# Patient Record
Sex: Female | Born: 1955 | Race: White | Hispanic: No | Marital: Married | Smoking: Never smoker
Health system: Southern US, Community
[De-identification: ages and names within clinical notes are randomized; demographics above are authoritative.]

## PROBLEM LIST (undated history)

## (undated) DIAGNOSIS — E785 Hyperlipidemia, unspecified: Secondary | ICD-10-CM

## (undated) HISTORY — PX: URETHRAL SLING: SHX2621

## (undated) HISTORY — DX: Hyperlipidemia, unspecified: E78.5

## (undated) HISTORY — PX: CATARACT EXTRACTION, BILATERAL: SHX1313

## (undated) HISTORY — PX: ABDOMINAL HYSTERECTOMY: SHX81

## (undated) HISTORY — PX: CHOLECYSTECTOMY: SHX55

## (undated) HISTORY — PX: HERNIA REPAIR: SHX51

---

## 1998-08-23 HISTORY — PX: ABDOMINAL HYSTERECTOMY: SHX81

## 1999-08-24 HISTORY — PX: CHOLECYSTECTOMY: SHX55

## 2013-05-10 ENCOUNTER — Other Ambulatory Visit: Payer: Self-pay

## 2013-07-06 ENCOUNTER — Ambulatory Visit (INDEPENDENT_AMBULATORY_CARE_PROVIDER_SITE_OTHER): Payer: BC Managed Care – PPO | Admitting: General Practice

## 2013-07-06 ENCOUNTER — Encounter: Payer: Self-pay | Admitting: General Practice

## 2013-07-06 VITALS — BP 152/91 | HR 77 | Temp 97.8°F | Ht 59.0 in | Wt 138.0 lb

## 2013-07-06 DIAGNOSIS — E785 Hyperlipidemia, unspecified: Secondary | ICD-10-CM

## 2013-07-06 MED ORDER — SIMVASTATIN 40 MG PO TABS: 40.0000 mg | ORAL_TABLET | Freq: Every day | ORAL | Status: DC

## 2013-07-06 NOTE — Progress Notes (Signed)
  Subjective:    Patient ID: Amber Winters, female    DOB: 08-06-1956, 57 y.o.   MRN: [id number]  HPI Patient presents today for chronic health follow up. She has a history of hyperlipidemia and taking medication (crestor) normally as directed, although out of medication for past two weeks. She reports trying to eat healthier. Denies any other medical problems or concerns.     Review of Systems  Constitutional: Negative for fever and chills.  Respiratory: Negative for cough, chest tightness, shortness of breath and wheezing.   Cardiovascular: Negative for chest pain and palpitations.  Gastrointestinal: Negative for abdominal pain, diarrhea, constipation and blood in stool.  Genitourinary: Negative for dysuria, hematuria and difficulty urinating.  Musculoskeletal: Negative for back pain, neck pain and neck stiffness.  Neurological: Negative for dizziness, weakness and headaches.       Objective:   Physical Exam  Constitutional: She is oriented to person, place, and time. She appears well-developed and well-nourished.  HENT:  Head: Normocephalic and atraumatic.  Right Ear: External ear normal.  Left Ear: External ear normal.  Nose: Nose normal.  Mouth/Throat: Oropharynx is clear and moist.  Eyes: EOM are normal. Pupils are equal, round, and reactive to light.  Neck: Normal range of motion. Neck supple. No thyromegaly present.  Cardiovascular: Normal rate, regular rhythm and normal heart sounds.   Pulmonary/Chest: Effort normal and breath sounds normal. No respiratory distress. She exhibits no tenderness.  Abdominal: Soft. Bowel sounds are normal. She exhibits no distension. There is no tenderness.  Musculoskeletal: She exhibits no edema and no tenderness.  Lymphadenopathy:    She has no cervical adenopathy.  Neurological: She is alert and oriented to person, place, and time.  Skin: Skin is warm and dry.  Psychiatric: She has a normal mood and affect.          Assessment & Plan:   1. Hyperlipidemia - CMP14+EGFR - NMR, lipoprofile - simvastatin (ZOCOR) 40 MG tablet; Take 1 tablet (40 mg total) by mouth daily.  Dispense: 30 tablet; Refill: 5 -Continue all current medications Labs pending F/u in 6 months Discussed exercise and diet  Patient verbalized understanding Coralie Keens, FNP-C

## 2013-07-06 NOTE — Patient Instructions (Signed)
Hypertriglyceridemia  Diet for High blood levels of Triglycerides Most fats in food are triglycerides. Triglycerides in your blood are stored as fat in your body. High levels of triglycerides in your blood may put you at a greater risk for heart disease and stroke.  Normal triglyceride levels are less than 150 mg/dL. Borderline high levels are 150-199 mg/dl. High levels are 200 - 499 mg/dL, and very high triglyceride levels are greater than 500 mg/dL. The decision to treat high triglycerides is generally based on the level. For people with borderline or high triglyceride levels, treatment includes weight loss and exercise. Drugs are recommended for people with very high triglyceride levels. Many people who need treatment for high triglyceride levels have metabolic syndrome. This syndrome is a collection of disorders that often include: insulin resistance, high blood pressure, blood clotting problems, high cholesterol and triglycerides. TESTING PROCEDURE FOR TRIGLYCERIDES  You should not eat 4 hours before getting your triglycerides measured. The normal range of triglycerides is between 10 and 250 milligrams per deciliter (mg/dl). Some people may have extreme levels (1000 or above), but your triglyceride level may be too high if it is above 150 mg/dl, depending on what other risk factors you have for heart disease.  People with high blood triglycerides may also have high blood cholesterol levels. If you have high blood cholesterol as well as high blood triglycerides, your risk for heart disease is probably greater than if you only had high triglycerides. High blood cholesterol is one of the main risk factors for heart disease. CHANGING YOUR DIET  Your weight can affect your blood triglyceride level. If you are more than 20% above your ideal body weight, you may be able to lower your blood triglycerides by losing weight. Eating less and exercising regularly is the best way to combat this. Fat provides more  calories than any other food. The best way to lose weight is to eat less fat. Only 30% of your total calories should come from fat. Less than 7% of your diet should come from saturated fat. A diet low in fat and saturated fat is the same as a diet to decrease blood cholesterol. By eating a diet lower in fat, you may lose weight, lower your blood cholesterol, and lower your blood triglyceride level.  Eating a diet low in fat, especially saturated fat, may also help you lower your blood triglyceride level. Ask your dietitian to help you figure how much fat you can eat based on the number of calories your caregiver has prescribed for you.  Exercise, in addition to helping with weight loss may also help lower triglyceride levels.   Alcohol can increase blood triglycerides. You may need to stop drinking alcoholic beverages.  Too much carbohydrate in your diet may also increase your blood triglycerides. Some complex carbohydrates are necessary in your diet. These may include bread, rice, potatoes, other starchy vegetables and cereals.  Reduce "simple" carbohydrates. These may include pure sugars, candy, honey, and jelly without losing other nutrients. If you have the kind of high blood triglycerides that is affected by the amount of carbohydrates in your diet, you will need to eat less sugar and less high-sugar foods. Your caregiver can help you with this.  Adding 2-4 grams of fish oil (EPA+ DHA) may also help lower triglycerides. Speak with your caregiver before adding any supplements to your regimen. Following the Diet  Maintain your ideal weight. Your caregivers can help you with a diet. Generally, eating less food and getting more   exercise will help you lose weight. Joining a weight control group may also help. Ask your caregivers for a good weight control group in your area.  Eat low-fat foods instead of high-fat foods. This can help you lose weight too.  These foods are lower in fat. Eat MORE of these:    Dried beans, peas, and lentils.  Egg whites.  Low-fat cottage cheese.  Fish.  Lean cuts of meat, such as round, sirloin, rump, and flank (cut extra fat off meat you fix).  Whole grain breads, cereals and pasta.  Skim and nonfat dry milk.  Low-fat yogurt.  Poultry without the skin.  Cheese made with skim or part-skim milk, such as mozzarella, parmesan, farmers', ricotta, or pot cheese. These are higher fat foods. Eat LESS of these:   Whole milk and foods made from whole milk, such as American, blue, cheddar, monterey jack, and swiss cheese  High-fat meats, such as luncheon meats, sausages, knockwurst, bratwurst, hot dogs, ribs, corned beef, ground pork, and regular ground beef.  Fried foods. Limit saturated fats in your diet. Substituting unsaturated fat for saturated fat may decrease your blood triglyceride level. You will need to read package labels to know which products contain saturated fats.  These foods are high in saturated fat. Eat LESS of these:   Fried pork skins.  Whole milk.  Skin and fat from poultry.  Palm oil.  Butter.  Shortening.  Cream cheese.  Bacon.  Margarines and baked goods made from listed oils.  Vegetable shortenings.  Chitterlings.  Fat from meats.  Coconut oil.  Palm kernel oil.  Lard.  Cream.  Sour cream.  Fatback.  Coffee whiteners and non-dairy creamers made with these oils.  Cheese made from whole milk. Use unsaturated fats (both polyunsaturated and monounsaturated) moderately. Remember, even though unsaturated fats are better than saturated fats; you still want a diet low in total fat.  These foods are high in unsaturated fat:   Canola oil.  Sunflower oil.  Mayonnaise.  Almonds.  Peanuts.  Pine nuts.  Margarines made with these oils.  Safflower oil.  Olive oil.  Avocados.  Cashews.  Peanut butter.  Sunflower seeds.  Soybean oil.  Peanut  oil.  Olives.  Pecans.  Walnuts.  Pumpkin seeds. Avoid sugar and other high-sugar foods. This will decrease carbohydrates without decreasing other nutrients. Sugar in your food goes rapidly to your blood. When there is excess sugar in your blood, your liver may use it to make more triglycerides. Sugar also contains calories without other important nutrients.  Eat LESS of these:   Sugar, brown sugar, powdered sugar, jam, jelly, preserves, honey, syrup, molasses, pies, candy, cakes, cookies, frosting, pastries, colas, soft drinks, punches, fruit drinks, and regular gelatin.  Avoid alcohol. Alcohol, even more than sugar, may increase blood triglycerides. In addition, alcohol is high in calories and low in nutrients. Ask for sparkling water, or a diet soft drink instead of an alcoholic beverage. Suggestions for planning and preparing meals   Bake, broil, grill or roast meats instead of frying.  Remove fat from meats and skin from poultry before cooking.  Add spices, herbs, lemon juice or vinegar to vegetables instead of salt, rich sauces or gravies.  Use a non-stick skillet without fat or use no-stick sprays.  Cool and refrigerate stews and broth. Then remove the hardened fat floating on the surface before serving.  Refrigerate meat drippings and skim off fat to make low-fat gravies.  Serve more fish.  Use less butter,   margarine and other high-fat spreads on bread or vegetables.  Use skim or reconstituted non-fat dry milk for cooking.  Cook with low-fat cheeses.  Substitute low-fat yogurt or cottage cheese for all or part of the sour cream in recipes for sauces, dips or congealed salads.  Use half yogurt/half mayonnaise in salad recipes.  Substitute evaporated skim milk for cream. Evaporated skim milk or reconstituted non-fat dry milk can be whipped and substituted for whipped cream in certain recipes.  Choose fresh fruits for dessert instead of high-fat foods such as pies or  cakes. Fruits are naturally low in fat. When Dining Out   Order low-fat appetizers such as fruit or vegetable juice, pasta with vegetables or tomato sauce.  Select clear, rather than cream soups.  Ask that dressings and gravies be served on the side. Then use less of them.  Order foods that are baked, broiled, poached, steamed, stir-fried, or roasted.  Ask for margarine instead of butter, and use only a small amount.  Drink sparkling water, unsweetened tea or coffee, or diet soft drinks instead of alcohol or other sweet beverages. QUESTIONS AND ANSWERS ABOUT OTHER FATS IN THE BLOOD: SATURATED FAT, TRANS FAT, AND CHOLESTEROL What is trans fat? Trans fat is a type of fat that is formed when vegetable oil is hardened through a process called hydrogenation. This process helps makes foods more solid, gives them shape, and prolongs their shelf life. Trans fats are also called hydrogenated or partially hydrogenated oils.  What do saturated fat, trans fat, and cholesterol in foods have to do with heart disease? Saturated fat, trans fat, and cholesterol in the diet all raise the level of LDL "bad" cholesterol in the blood. The higher the LDL cholesterol, the greater the risk for coronary heart disease (CHD). Saturated fat and trans fat raise LDL similarly.  What foods contain saturated fat, trans fat, and cholesterol? High amounts of saturated fat are found in animal products, such as fatty cuts of meat, chicken skin, and full-fat dairy products like butter, whole milk, cream, and cheese, and in tropical vegetable oils such as palm, palm kernel, and coconut oil. Trans fat is found in some of the same foods as saturated fat, such as vegetable shortening, some margarines (especially hard or stick margarine), crackers, cookies, baked goods, fried foods, salad dressings, and other processed foods made with partially hydrogenated vegetable oils. Small amounts of trans fat also occur naturally in some animal  products, such as milk products, beef, and lamb. Foods high in cholesterol include liver, other organ meats, egg yolks, shrimp, and full-fat dairy products. How can I use the new food label to make heart-healthy food choices? Check the Nutrition Facts panel of the food label. Choose foods lower in saturated fat, trans fat, and cholesterol. For saturated fat and cholesterol, you can also use the Percent Daily Value (%DV): 5% DV or less is low, and 20% DV or more is high. (There is no %DV for trans fat.) Use the Nutrition Facts panel to choose foods low in saturated fat and cholesterol, and if the trans fat is not listed, read the ingredients and limit products that list shortening or hydrogenated or partially hydrogenated vegetable oil, which tend to be high in trans fat. POINTS TO REMEMBER:   Discuss your risk for heart disease with your caregivers, and take steps to reduce risk factors.  Change your diet. Choose foods that are low in saturated fat, trans fat, and cholesterol.  Add exercise to your daily routine if   it is not already being done. Participate in physical activity of moderate intensity, like brisk walking, for at least 30 minutes on most, and preferably all days of the week. No time? Break the 30 minutes into three, 10-minute segments during the day.  Stop smoking. If you do smoke, contact your caregiver to discuss ways in which they can help you quit.  Do not use street drugs.  Maintain a normal weight.  Maintain a healthy blood pressure.  Keep up with your blood work for checking the fats in your blood as directed by your caregiver. Document Released: 05/27/2004 Document Revised: 02/08/2012 Document Reviewed: 12/23/2008 Exit[employer] Patient Information 2014 [employer].  

## 2013-07-08 LAB — CMP14+EGFR
Albumin: 4.5 g/dL (ref 3.5–5.5)
BUN/Creatinine Ratio: 23 (ref 9–23)
BUN: 14 mg/dL (ref 6–24)
CO2: 26 mmol/L (ref 18–29)
Calcium: 10.1 mg/dL (ref 8.7–10.2)
Creatinine, Ser: 0.62 mg/dL (ref 0.57–1.00)
Globulin, Total: 2.7 g/dL (ref 1.5–4.5)
Glucose: 80 mg/dL (ref 65–99)
Total Protein: 7.2 g/dL (ref 6.0–8.5)

## 2013-07-08 LAB — NMR, LIPOPROFILE
Cholesterol: 263 mg/dL — ABNORMAL HIGH (ref <200)
HDL Cholesterol by NMR: 71 mg/dL (ref >=40)
HDL Particle Number: 44.6 umol/L (ref >=30.5)
LDLC SERPL CALC-MCNC: 155 mg/dL — ABNORMAL HIGH (ref <100)

## 2013-07-12 ENCOUNTER — Other Ambulatory Visit: Payer: Self-pay | Admitting: General Practice

## 2013-07-13 ENCOUNTER — Telehealth: Payer: Self-pay | Admitting: General Practice

## 2013-07-13 NOTE — Telephone Encounter (Signed)
Patient aware.

## 2013-09-28 ENCOUNTER — Telehealth: Payer: Self-pay | Admitting: General Practice

## 2013-09-28 ENCOUNTER — Ambulatory Visit (INDEPENDENT_AMBULATORY_CARE_PROVIDER_SITE_OTHER): Payer: BC Managed Care – PPO

## 2013-09-28 DIAGNOSIS — Z7189 Other specified counseling: Secondary | ICD-10-CM

## 2013-09-28 NOTE — Telephone Encounter (Signed)
GOING TO SCHED. APPT

## 2013-09-29 LAB — MEASLES/MUMPS/RUBELLA IMMUNITY
MUMPS ABS, IGG: 110 [AU]/ml (ref >10.9)
RUBELLA: 1 {index} (ref >0.99)
RUBEOLA AB, IGG: 157 [AU]/ml (ref >29.9)

## 2013-10-02 ENCOUNTER — Telehealth: Payer: Self-pay | Admitting: General Practice

## 2013-10-02 NOTE — Telephone Encounter (Signed)
Can you review the results for them please.

## 2013-10-03 NOTE — Telephone Encounter (Signed)
Please inform lab results indicate immunity

## 2013-10-03 NOTE — Telephone Encounter (Signed)
Per [facility] patient has immunity on labs to MMR. Pt  Notified and verbalized understanding.

## 2013-11-12 ENCOUNTER — Telehealth: Payer: Self-pay | Admitting: General Practice

## 2013-11-13 NOTE — Telephone Encounter (Signed)
appt with mae scheduled

## 2013-11-16 ENCOUNTER — Encounter: Payer: Self-pay | Admitting: General Practice

## 2013-11-16 ENCOUNTER — Ambulatory Visit (INDEPENDENT_AMBULATORY_CARE_PROVIDER_SITE_OTHER): Payer: BC Managed Care – PPO | Admitting: General Practice

## 2013-11-16 VITALS — BP 147/83 | HR 90 | Temp 99.1°F | Ht 59.0 in | Wt 146.0 lb

## 2013-11-16 DIAGNOSIS — K219 Gastro-esophageal reflux disease without esophagitis: Secondary | ICD-10-CM

## 2013-11-16 MED ORDER — ESOMEPRAZOLE MAGNESIUM 20 MG PO PACK: 20.0000 mg | PACK | Freq: Every day | ORAL | Status: DC

## 2013-11-16 NOTE — Progress Notes (Signed)
   Subjective:    Patient ID: Amber Winters, female    DOB: Aug 17, 1956, 58 y.o.   MRN: [id number]  Gastrophageal Reflux She complains of heartburn. She reports no abdominal pain, no belching, no chest pain, no coughing, no sore throat or no wheezing. This is a new problem. The current episode started 1 to 4 weeks ago. The problem has been unchanged. The heartburn does not wake her from sleep. The heartburn does not limit her activity. The heartburn doesn't change with position. The symptoms are aggravated by lying down. There are no known risk factors.      Review of Systems  Constitutional: Negative for fever and chills.  HENT: Negative for sore throat.   Respiratory: Negative for cough, chest tightness and wheezing.   Cardiovascular: Negative for chest pain and palpitations.  Gastrointestinal: Positive for heartburn. Negative for abdominal pain.  Neurological: Negative for dizziness, weakness and headaches.       Objective:   Physical Exam  Constitutional: She is oriented to person, place, and time. She appears well-developed and well-nourished.  Cardiovascular: Normal rate, regular rhythm and normal heart sounds.   Pulmonary/Chest: Breath sounds normal. No respiratory distress. She has no wheezes.  Neurological: She is alert and oriented to person, place, and time.  Skin: Skin is warm and dry.  Psychiatric: She has a normal mood and affect.          Assessment & Plan:  1. GERD (gastroesophageal reflux disease) - esomeprazole (NEXIUM) 20 MG packet; Take 20 mg by mouth daily before breakfast.  Dispense: 30 each; Refill: 5 -GERD patient education provided and discussed -RTO if symptoms worsen or no improvement Patient verbalized understanding Erby Pian, FNP-C

## 2013-11-16 NOTE — Patient Instructions (Signed)
Gastroesophageal Reflux Disease, Adult  Gastroesophageal reflux disease (GERD) happens when acid from your stomach flows up into the esophagus. When acid comes in contact with the esophagus, the acid causes soreness (inflammation) in the esophagus. Over time, GERD may create small holes (ulcers) in the lining of the esophagus.  CAUSES   · Increased body weight. This puts pressure on the stomach, making acid rise from the stomach into the esophagus.  · Smoking. This increases acid production in the stomach.  · Drinking alcohol. This causes decreased pressure in the lower esophageal sphincter (valve or ring of muscle between the esophagus and stomach), allowing acid from the stomach into the esophagus.  · Late evening meals and a full stomach. This increases pressure and acid production in the stomach.  · A malformed lower esophageal sphincter.  Sometimes, no cause is found.  SYMPTOMS   · Burning pain in the lower part of the mid-chest behind the breastbone and in the mid-stomach area. This may occur twice a week or more often.  · Trouble swallowing.  · Sore throat.  · Dry cough.  · Asthma-like symptoms including chest tightness, shortness of breath, or wheezing.  DIAGNOSIS   Your caregiver may be able to diagnose GERD based on your symptoms. In some cases, X-rays and other tests may be done to check for complications or to check the condition of your stomach and esophagus.  TREATMENT   Your caregiver may recommend over-the-counter or prescription medicines to help decrease acid production. Ask your caregiver before starting or adding any new medicines.   HOME CARE INSTRUCTIONS   · Change the factors that you can control. Ask your caregiver for guidance concerning weight loss, quitting smoking, and alcohol consumption.  · Avoid foods and drinks that make your symptoms worse, such as:  · Caffeine or alcoholic drinks.  · Chocolate.  · Peppermint or mint flavorings.  · Garlic and onions.  · Spicy foods.  · Citrus fruits,  such as oranges, lemons, or limes.  · Tomato-based foods such as sauce, chili, salsa, and pizza.  · Fried and fatty foods.  · Avoid lying down for the 3 hours prior to your bedtime or prior to taking a nap.  · Eat small, frequent meals instead of large meals.  · Wear loose-fitting clothing. Do not wear anything tight around your waist that causes pressure on your stomach.  · Raise the head of your bed 6 to 8 inches with wood blocks to help you sleep. Extra pillows will not help.  · Only take over-the-counter or prescription medicines for pain, discomfort, or fever as directed by your caregiver.  · Do not take aspirin, ibuprofen, or other nonsteroidal anti-inflammatory drugs (NSAIDs).  SEEK IMMEDIATE MEDICAL CARE IF:   · You have pain in your arms, neck, jaw, teeth, or back.  · Your pain increases or changes in intensity or duration.  · You develop nausea, vomiting, or sweating (diaphoresis).  · You develop shortness of breath, or you faint.  · Your vomit is green, yellow, black, or looks like coffee grounds or blood.  · Your stool is red, bloody, or black.  These symptoms could be signs of other problems, such as heart disease, gastric bleeding, or esophageal bleeding.  MAKE SURE YOU:   · Understand these instructions.  · Will watch your condition.  · Will get help right away if you are not doing well or get worse.  Document Released: 05/19/2005 Document Revised: 11/01/2011 Document Reviewed: 02/26/2011  Exit[employer]® Patient   Information ©2014 [employer].

## 2014-05-11 ENCOUNTER — Other Ambulatory Visit: Payer: Self-pay | Admitting: Nurse Practitioner

## 2014-06-11 ENCOUNTER — Other Ambulatory Visit: Payer: Self-pay | Admitting: Family Medicine

## 2014-06-13 MED ORDER — NEXIUM 20 MG PO CPDR: 20.0000 mg | DELAYED_RELEASE_CAPSULE | Freq: Every day | ORAL | Status: DC

## 2014-06-13 NOTE — Telephone Encounter (Signed)
no more refills without being seen  

## 2014-06-13 NOTE — Addendum Note (Signed)
Addended by: Thana Ates on: 06/13/2014 02:55 PM   Modules accepted: Orders

## 2014-06-13 NOTE — Telephone Encounter (Signed)
Patient of Mae. Was notified at last refill that NTBS. Please advise. Last seen in March 2015

## 2014-07-01 ENCOUNTER — Other Ambulatory Visit: Payer: Self-pay | Admitting: Nurse Practitioner

## 2014-07-01 NOTE — Telephone Encounter (Signed)
Has appointment 08/2014

## 2014-07-25 ENCOUNTER — Other Ambulatory Visit: Payer: Self-pay | Admitting: Nurse Practitioner

## 2014-08-09 ENCOUNTER — Other Ambulatory Visit: Payer: Self-pay | Admitting: Nurse Practitioner

## 2014-09-06 ENCOUNTER — Ambulatory Visit (INDEPENDENT_AMBULATORY_CARE_PROVIDER_SITE_OTHER): Payer: BLUE CROSS/BLUE SHIELD | Admitting: Nurse Practitioner

## 2014-09-06 ENCOUNTER — Encounter: Payer: Self-pay | Admitting: Nurse Practitioner

## 2014-09-06 VITALS — BP 132/84 | HR 81 | Temp 97.4°F | Ht 59.0 in | Wt 145.0 lb

## 2014-09-06 DIAGNOSIS — Z13820 Encounter for screening for osteoporosis: Secondary | ICD-10-CM

## 2014-09-06 DIAGNOSIS — K219 Gastro-esophageal reflux disease without esophagitis: Secondary | ICD-10-CM | POA: Insufficient documentation

## 2014-09-06 DIAGNOSIS — Z23 Encounter for immunization: Secondary | ICD-10-CM

## 2014-09-06 DIAGNOSIS — E785 Hyperlipidemia, unspecified: Secondary | ICD-10-CM

## 2014-09-06 MED ORDER — NEXIUM 20 MG PO CPDR: DELAYED_RELEASE_CAPSULE | ORAL | Status: DC

## 2014-09-06 MED ORDER — SIMVASTATIN 40 MG PO TABS: ORAL_TABLET | ORAL | Status: DC

## 2014-09-06 NOTE — Patient Instructions (Signed)

## 2014-09-06 NOTE — Progress Notes (Signed)
   Subjective:    Patient ID: Amber Winters, female    DOB: 1956/06/21, 59 y.o.   MRN: [id number]   Patient here today for follow up of chronic medical problems.   Hyperlipidemia This is a chronic problem. The current episode started more than 1 year ago. The problem is controlled. Recent lipid tests were reviewed and are variable. Current antihyperlipidemic treatment includes statins. The current treatment provides moderate improvement of lipids. Risk factors for coronary artery disease include dyslipidemia, obesity and post-menopausal.  GERD Patient currently on nexium daily- keeps symptoms under control   Review of Systems  Constitutional: Negative.   HENT: Negative.   Respiratory: Negative.   Cardiovascular: Negative.   Genitourinary: Negative.   Neurological: Negative.   Psychiatric/Behavioral: Negative.   All other systems reviewed and are negative.      Objective:   Physical Exam  Constitutional: She is oriented to person, place, and time. She appears well-developed and well-nourished.  HENT:  Nose: Nose normal.  Mouth/Throat: Oropharynx is clear and moist.  Eyes: EOM are normal.  Neck: Trachea normal, normal range of motion and full passive range of motion without pain. Neck supple. No JVD present. Carotid bruit is not present. No thyromegaly present.  Cardiovascular: Normal rate, regular rhythm, normal heart sounds and intact distal pulses.  Exam reveals no gallop and no friction rub.   No murmur heard. Pulmonary/Chest: Effort normal and breath sounds normal.  Abdominal: Soft. Bowel sounds are normal. She exhibits no distension and no mass. There is no tenderness.  Musculoskeletal: Normal range of motion.  Lymphadenopathy:    She has no cervical adenopathy.  Neurological: She is alert and oriented to person, place, and time. She has normal reflexes.  Skin: Skin is warm and dry.  Psychiatric: She has a normal mood and affect. Her behavior is normal. Judgment and thought  content normal.    BP 132/84 mmHg  Pulse 81  Temp(Src) 97.4 F (36.3 C) (Oral)  Ht $R'4\' 11"'vv$  (1.499 m)  Wt 145 lb (65.772 kg)  BMI 29.27 kg/m2        Assessment & Plan:  1. Hyperlipidemia with target LDL less than 100 Low fat diet - simvastatin (ZOCOR) 40 MG tablet; TAKE 1 TABLET (40 MG TOTAL) BY MOUTH DAILY.  Dispense: 30 tablet; Refill: 5 - CMP14+EGFR - NMR, lipoprofile  2. Gastroesophageal reflux disease without esophagitis Watch spicy foods Do not eat 2 hours prior to bedtime - NEXIUM 20 MG capsule; TAKE 1 CAPSULE (20 MG TOTAL) BY MOUTH DAILY. NO MORE REFILLS UNTIL SEEN.  Dispense: 30 capsule; Refill: 5  3. Screening for osteoporosis Weight bearing - DG Bone Density; Future    Labs pending Health maintenance reviewed Diet and exercise encouraged Continue all meds Follow up  In 6 months   [place], FNP

## 2014-09-07 LAB — CMP14+EGFR
ALK PHOS: 76 IU/L (ref 39–117)
ALT: 12 IU/L (ref 0–32)
AST: 21 IU/L (ref 0–40)
Albumin/Globulin Ratio: 1.7 (ref 1.1–2.5)
Albumin: 4.5 g/dL (ref 3.5–5.5)
BUN/Creatinine Ratio: 30 — ABNORMAL HIGH (ref 9–23)
BUN: 20 mg/dL (ref 6–24)
CHLORIDE: 99 mmol/L (ref 97–108)
CO2: 25 mmol/L (ref 18–29)
Calcium: 9.9 mg/dL (ref 8.7–10.2)
Creatinine, Ser: 0.66 mg/dL (ref 0.57–1.00)
GFR calc non Af Amer: 98 mL/min/{1.73_m2} (ref >59)
GFR, EST AFRICAN AMERICAN: 113 mL/min/{1.73_m2} (ref >59)
GLOBULIN, TOTAL: 2.7 g/dL (ref 1.5–4.5)
GLUCOSE: 81 mg/dL (ref 65–99)
Potassium: 4.9 mmol/L (ref 3.5–5.2)
Sodium: 140 mmol/L (ref 134–144)
Total Bilirubin: 1 mg/dL (ref 0.0–1.2)
Total Protein: 7.2 g/dL (ref 6.0–8.5)

## 2014-09-07 LAB — NMR, LIPOPROFILE
CHOLESTEROL: 210 mg/dL — AB (ref 100–199)
HDL CHOLESTEROL BY NMR: 69 mg/dL (ref >39)
HDL PARTICLE NUMBER: 42.4 umol/L (ref >=30.5)
LDL PARTICLE NUMBER: 1356 nmol/L — AB (ref <1000)
LDL Size: 20.9 nm (ref >20.5)
LDL-C: 121 mg/dL — AB (ref 0–99)
LP-IR SCORE: 44 (ref <=45)
Small LDL Particle Number: 442 nmol/L (ref <=527)
TRIGLYCERIDES BY NMR: 101 mg/dL (ref 0–149)

## 2014-09-09 ENCOUNTER — Telehealth: Payer: Self-pay | Admitting: Nurse Practitioner

## 2014-09-09 NOTE — Telephone Encounter (Signed)
Stp advised of MMM feedback, pt voiced understanding. Will close encounter.

## 2014-09-09 NOTE — Telephone Encounter (Signed)
Was probably form the preservative in vaccine- shouldn't need another one anyway

## 2014-09-11 ENCOUNTER — Other Ambulatory Visit: Payer: Self-pay | Admitting: Nurse Practitioner

## 2014-09-12 MED ORDER — OMEPRAZOLE 40 MG PO CPDR: 40.0000 mg | DELAYED_RELEASE_CAPSULE | Freq: Every day | ORAL | Status: DC

## 2014-09-12 NOTE — Telephone Encounter (Signed)
chaged nexium to omeprazole

## 2014-09-12 NOTE — Telephone Encounter (Signed)
Patient aware.

## 2014-10-31 ENCOUNTER — Other Ambulatory Visit: Payer: Self-pay | Admitting: *Deleted

## 2014-10-31 MED ORDER — OMEPRAZOLE 40 MG PO CPDR: 40.0000 mg | DELAYED_RELEASE_CAPSULE | Freq: Every day | ORAL | Status: DC

## 2014-11-06 ENCOUNTER — Telehealth: Payer: Self-pay

## 2014-11-06 NOTE — Telephone Encounter (Signed)
LMOM to call xray to schedule DEXA appointment

## 2014-11-18 ENCOUNTER — Ambulatory Visit (INDEPENDENT_AMBULATORY_CARE_PROVIDER_SITE_OTHER): Payer: BLUE CROSS/BLUE SHIELD

## 2014-11-18 DIAGNOSIS — Z780 Asymptomatic menopausal state: Secondary | ICD-10-CM

## 2014-11-18 DIAGNOSIS — Z13820 Encounter for screening for osteoporosis: Secondary | ICD-10-CM

## 2014-11-22 ENCOUNTER — Other Ambulatory Visit: Payer: BLUE CROSS/BLUE SHIELD

## 2015-02-22 ENCOUNTER — Other Ambulatory Visit: Payer: Self-pay | Admitting: Nurse Practitioner

## 2015-02-25 NOTE — Telephone Encounter (Signed)
Last seen 09/06/14 MMM

## 2016-04-01 ENCOUNTER — Ambulatory Visit (INDEPENDENT_AMBULATORY_CARE_PROVIDER_SITE_OTHER): Payer: BLUE CROSS/BLUE SHIELD | Admitting: Family

## 2016-04-01 ENCOUNTER — Encounter: Payer: Self-pay | Admitting: Family

## 2016-04-01 VITALS — BP 133/87 | HR 74 | Temp 97.8°F | Ht 59.0 in | Wt 150.0 lb

## 2016-04-01 DIAGNOSIS — E559 Vitamin D deficiency, unspecified: Secondary | ICD-10-CM | POA: Diagnosis not present

## 2016-04-01 DIAGNOSIS — K219 Gastro-esophageal reflux disease without esophagitis: Secondary | ICD-10-CM

## 2016-04-01 DIAGNOSIS — E785 Hyperlipidemia, unspecified: Secondary | ICD-10-CM

## 2016-04-01 DIAGNOSIS — Z1159 Encounter for screening for other viral diseases: Secondary | ICD-10-CM | POA: Diagnosis not present

## 2016-04-01 MED ORDER — VITAMIN D (ERGOCALCIFEROL) 1.25 MG (50000 UNIT) PO CAPS: 50000.0000 [IU] | ORAL_CAPSULE | ORAL | 3 refills | Status: DC

## 2016-04-01 NOTE — Patient Instructions (Signed)
Vitamin D Deficiency Vitamin D deficiency is when your body does not have enough vitamin D. Vitamin D is important to your body for many reasons:  It helps the body to absorb two important minerals, called calcium and phosphorus.  It plays a role in bone health.  It may help to prevent some diseases, such as diabetes and multiple sclerosis.  It plays a role in muscle function, including heart function. You can get vitamin D by:  Eating foods that naturally contain vitamin D.  Eating or drinking milk or other dairy products that have vitamin D added to them.  Taking a vitamin D supplement or a multivitamin supplement that contains vitamin D.  Being in the sun. Your body naturally makes vitamin D when your skin is exposed to sunlight. Your body changes the sunlight into a form of the vitamin that the body can use. If vitamin D deficiency is severe, it can cause a condition in which your bones become soft. In adults, this condition is called osteomalacia. In children, this condition is called rickets. CAUSES Vitamin D deficiency may be caused by:  Not eating enough foods that contain vitamin D.  Not getting enough sun exposure.  Having certain digestive system diseases that make it difficult for your body to absorb vitamin D. These diseases include Crohn disease, chronic pancreatitis, and cystic fibrosis.  Having a surgery in which a part of the stomach or a part of the small intestine is removed.  Being obese.  Having chronic kidney disease or liver disease. RISK FACTORS This condition is more likely to develop in:  Older people.  People who do not spend much time outdoors.  People who live in a long-term care facility.  People who have had broken bones.  People with weak or thin bones (osteoporosis).  People who have a disease or condition that changes how the body absorbs vitamin D.  People who have dark skin.  People who take certain medicines, such as steroid  medicines or certain seizure medicines.  People who are overweight or obese. SYMPTOMS In mild cases of vitamin D deficiency, there may not be any symptoms. If the condition is severe, symptoms may include:  Bone pain.  Muscle pain.  Falling often.  Broken bones caused by a minor injury. DIAGNOSIS This condition is usually diagnosed with a blood test.  TREATMENT Treatment for this condition may depend on what caused the condition. Treatment options include:  Taking vitamin D supplements.  Taking a calcium supplement. Your health care provider will suggest what dose is best for you. HOME CARE INSTRUCTIONS  Take medicines and supplements only as told by your health care provider.  Eat foods that contain vitamin D. Choices include:  Fortified dairy products, cereals, or juices. Fortified means that vitamin D has been added to the food. Check the label on the package to be sure.  Fatty fish, such as salmon or trout.  Eggs.  Oysters.  Do not use a tanning bed.  Maintain a healthy weight. Lose weight, if needed.  Keep all follow-up visits as told by your health care provider. This is important. SEEK MEDICAL CARE IF:  Your symptoms do not go away.  You feel like throwing up (nausea) or you throw up (vomit).  You have fewer bowel movements than usual or it is difficult for you to have a bowel movement (constipation).   This information is not intended to replace advice given to you by your health care provider. Make sure you discuss  any questions you have with your health care provider.   Document Released: 11/01/2011 Document Revised: 04/30/2015 Document Reviewed: 12/25/2014 Elsevier Interactive Patient Education [employer].

## 2016-04-01 NOTE — Progress Notes (Signed)
   Subjective:    Patient ID: Amber Winters, female    DOB: 1955-12-30, 60 y.o.   MRN: [id number]  Pt presents to the office today to get started on Vit D. Pt states she had a stress fracture on her left foot about a month ago and went to her Podiatry who tested her vit d and was told it was low.  Hyperlipidemia  This is a chronic problem. The current episode started more than 1 year ago. The problem is uncontrolled. Recent lipid tests were reviewed and are high. Exacerbating diseases include obesity. She has no history of hypothyroidism. Pertinent negatives include no chest pain. Current antihyperlipidemic treatment includes statins. The current treatment provides mild improvement of lipids. Risk factors for coronary artery disease include post-menopausal, stress, obesity and dyslipidemia.  Gastroesophageal Reflux  She complains of heartburn. She reports no chest pain or no stridor. This is a chronic problem. The current episode started more than 1 year ago. The problem occurs occasionally. The symptoms are aggravated by certain foods. She has tried an antacid for the symptoms. The treatment provided moderate relief.      Review of Systems  Cardiovascular: Negative for chest pain.  Gastrointestinal: Positive for heartburn.  Musculoskeletal: Positive for gait problem and joint swelling.  All other systems reviewed and are negative.      Objective:   Physical Exam  Constitutional: She is oriented to person, place, and time. She appears well-developed and well-nourished. No distress.  HENT:  Head: Normocephalic and atraumatic.  Eyes: Pupils are equal, round, and reactive to light.  Neck: Normal range of motion. Neck supple. No thyromegaly present.  Cardiovascular: Normal rate, regular rhythm, normal heart sounds and intact distal pulses.   No murmur heard. Pulmonary/Chest: Effort normal and breath sounds normal. No respiratory distress. She has no wheezes.  Abdominal: Soft. Bowel sounds are  normal. She exhibits no distension. There is no tenderness.  Musculoskeletal: Normal range of motion. She exhibits tenderness. She exhibits no edema.  orthopedic boot present on left foot  Neurological: She is alert and oriented to person, place, and time.  Skin: Skin is warm and dry.  Psychiatric: She has a normal mood and affect. Her behavior is normal. Judgment and thought content normal.  Vitals reviewed.     BP 133/87   Pulse 74   Temp 97.8 F (36.6 C) (Oral)   Ht '4\' 11"'$  (1.499 m)   Wt 150 lb (68 kg)   BMI 30.30 kg/m      Assessment & Plan:  1. Vitamin D deficiency - CMP14+EGFR; Future - Vitamin D, Ergocalciferol, (DRISDOL) 50000 units CAPS capsule; Take 1 capsule (50,000 Units total) by mouth every 7 (seven) days.  Dispense: 12 capsule; Refill: 3  2. Hyperlipidemia with target LDL less than 100 - CMP14+EGFR; Future - Lipid panel; Future  3. Gastroesophageal reflux disease, esophagitis presence not specified - CMP14+EGFR; Future  4. Need for hepatitis C screening test - Hepatitis C antibody; Future   Continue all meds Labs pending Health Maintenance reviewed Diet and exercise encouraged RTO 6 monts  Evelina Dun, FNP

## 2016-04-23 ENCOUNTER — Other Ambulatory Visit: Payer: BLUE CROSS/BLUE SHIELD

## 2016-04-23 DIAGNOSIS — Z1159 Encounter for screening for other viral diseases: Secondary | ICD-10-CM

## 2016-04-23 DIAGNOSIS — E785 Hyperlipidemia, unspecified: Secondary | ICD-10-CM

## 2016-04-23 DIAGNOSIS — K219 Gastro-esophageal reflux disease without esophagitis: Secondary | ICD-10-CM

## 2016-04-23 DIAGNOSIS — E559 Vitamin D deficiency, unspecified: Secondary | ICD-10-CM

## 2016-04-24 LAB — LIPID PANEL
CHOL/HDL RATIO: 4 ratio (ref 0.0–4.4)
Cholesterol, Total: 245 mg/dL — ABNORMAL HIGH (ref 100–199)
HDL: 62 mg/dL (ref >39)
LDL Calculated: 149 mg/dL — ABNORMAL HIGH (ref 0–99)
TRIGLYCERIDES: 171 mg/dL — AB (ref 0–149)
VLDL CHOLESTEROL CAL: 34 mg/dL (ref 5–40)

## 2016-04-24 LAB — CMP14+EGFR
A/G RATIO: 1.6 (ref 1.2–2.2)
ALT: 12 IU/L (ref 0–32)
AST: 17 IU/L (ref 0–40)
Albumin: 4.4 g/dL (ref 3.5–5.5)
Alkaline Phosphatase: 71 IU/L (ref 39–117)
BUN/Creatinine Ratio: 28 — ABNORMAL HIGH (ref 9–23)
BUN: 21 mg/dL (ref 6–24)
Bilirubin Total: 0.8 mg/dL (ref 0.0–1.2)
CALCIUM: 9.7 mg/dL (ref 8.7–10.2)
CO2: 26 mmol/L (ref 18–29)
CREATININE: 0.74 mg/dL (ref 0.57–1.00)
Chloride: 101 mmol/L (ref 96–106)
GFR, EST AFRICAN AMERICAN: 103 mL/min/{1.73_m2} (ref >59)
GFR, EST NON AFRICAN AMERICAN: 89 mL/min/{1.73_m2} (ref >59)
GLUCOSE: 75 mg/dL (ref 65–99)
Globulin, Total: 2.8 g/dL (ref 1.5–4.5)
Potassium: 5.8 mmol/L — ABNORMAL HIGH (ref 3.5–5.2)
Sodium: 140 mmol/L (ref 134–144)
TOTAL PROTEIN: 7.2 g/dL (ref 6.0–8.5)

## 2016-04-24 LAB — HEPATITIS C ANTIBODY: Hep C Virus Ab: <0.1 s/co ratio (ref 0.0–0.9)

## 2016-04-27 ENCOUNTER — Telehealth: Payer: Self-pay | Admitting: Family

## 2016-04-27 ENCOUNTER — Other Ambulatory Visit: Payer: Self-pay | Admitting: Family

## 2016-04-27 DIAGNOSIS — E875 Hyperkalemia: Secondary | ICD-10-CM

## 2016-04-27 MED ORDER — ATORVASTATIN CALCIUM 20 MG PO TABS: 20.0000 mg | ORAL_TABLET | Freq: Every day | ORAL | 1 refills | Status: DC

## 2016-04-27 NOTE — Telephone Encounter (Signed)
Explained all lab results to patient, patient voices understanding

## 2016-05-07 ENCOUNTER — Other Ambulatory Visit: Payer: BLUE CROSS/BLUE SHIELD

## 2016-05-07 DIAGNOSIS — E875 Hyperkalemia: Secondary | ICD-10-CM

## 2016-05-08 LAB — POTASSIUM: Potassium: 4.4 mmol/L (ref 3.5–5.2)

## 2016-10-21 ENCOUNTER — Other Ambulatory Visit: Payer: Self-pay | Admitting: Family

## 2017-02-03 ENCOUNTER — Encounter: Payer: Self-pay | Admitting: Pediatrics

## 2017-02-03 ENCOUNTER — Ambulatory Visit (INDEPENDENT_AMBULATORY_CARE_PROVIDER_SITE_OTHER): Payer: BLUE CROSS/BLUE SHIELD | Admitting: Pediatrics

## 2017-02-03 VITALS — BP 136/80 | HR 77 | Temp 97.0°F | Ht 59.0 in | Wt 152.4 lb

## 2017-02-03 DIAGNOSIS — R399 Unspecified symptoms and signs involving the genitourinary system: Secondary | ICD-10-CM | POA: Diagnosis not present

## 2017-02-03 DIAGNOSIS — N3090 Cystitis, unspecified without hematuria: Secondary | ICD-10-CM

## 2017-02-03 LAB — MICROSCOPIC EXAMINATION: WBC, UA: >30 /hpf — AB (ref 0–?)

## 2017-02-03 LAB — URINALYSIS, COMPLETE
Bilirubin, UA: NEGATIVE
GLUCOSE, UA: NEGATIVE
KETONES UA: NEGATIVE
NITRITE UA: NEGATIVE
SPEC GRAV UA: 1.015 (ref 1.005–1.030)
Urobilinogen, Ur: 0.2 mg/dL (ref 0.2–1.0)
pH, UA: 5.5 (ref 5.0–7.5)

## 2017-02-03 MED ORDER — NITROFURANTOIN MONOHYD MACRO 100 MG PO CAPS: 100.0000 mg | ORAL_CAPSULE | Freq: Two times a day (BID) | ORAL | 0 refills | Status: AC

## 2017-02-03 NOTE — Progress Notes (Signed)
  Subjective:   Patient ID: Amber Winters, female    DOB: 11-18-1955, 61 y.o.   MRN: [id number] CC: Urinary Urgency; Burning with urination  HPI: Amber Winters is a 61 y.o. female presenting for Urinary Urgency; Burning with urination;   Started last night Urinary frequency all night Feels like prior UTIs Normal appetite Small amount of incontinence  No back pain No fevers No abd pain Ate peaches, strawberries late last night, thinks caused some loose stool this morning  Had a hysterectomy then urethral swing, no incontinence unless with infection since then  Relevant past medical, surgical, family and social history reviewed. Allergies and medications reviewed and updated. History  Smoking Status  . Never Smoker  Smokeless Tobacco  . Never Used   ROS: Per HPI   Objective:    BP 136/80   Pulse 77   Temp 97 F (36.1 C) (Oral)   Ht 4\' 11"  (1.499 m)   Wt 152 lb 6.4 oz (69.1 kg)   BMI 30.78 kg/m   Wt Readings from Last 3 Encounters:  02/03/17 152 lb 6.4 oz (69.1 kg)  04/01/16 150 lb (68 kg)  09/06/14 145 lb (65.8 kg)    Gen: NAD, alert, cooperative with exam, NCAT EYES: EOMI, no conjunctival injection, or no icterus ENT:  OP without erythema LYMPH: no cervical LAD CV: NRRR, normal S1/S2, no murmur Resp: CTABL, no wheezes, normal WOB Abd: +BS, soft, NTND. no guarding or organomegaly, no CVA tenderness Ext: No edema, warm Neuro: Alert and oriented Assessment & Plan:  Amber Winters was seen today for urinary urgency, burning with urination. Diagnoses and all orders for this visit:  Cystitis UA positive, treat with with macrobid 100mg  BID x 5 days F/u culture  UTI symptoms -     Urinalysis, Complete -     Urine Culture  Follow up plan: As needed Amber Found, MD Amber Winters

## 2017-02-05 LAB — URINE CULTURE

## 2017-02-08 ENCOUNTER — Other Ambulatory Visit: Payer: Self-pay | Admitting: Family Medicine

## 2017-02-08 ENCOUNTER — Telehealth: Payer: Self-pay | Admitting: Pediatrics

## 2017-02-08 MED ORDER — CIPROFLOXACIN HCL 500 MG PO TABS: 500.0000 mg | ORAL_TABLET | Freq: Two times a day (BID) | ORAL | 0 refills | Status: DC

## 2017-02-08 NOTE — Telephone Encounter (Signed)
Patient aware that medication has been sent to pharmacy 

## 2017-02-08 NOTE — Telephone Encounter (Signed)
Patient called stating that she is still having UTI symptoms.  Foul urine odor, urinary pressure and urgency.  Last seen 02/03/17 by Evette Doffing and given macrobid.  Patient would like for something else to be sent to pharmacy

## 2017-02-08 NOTE — Telephone Encounter (Signed)
What symptoms do you have? Strong urine that is bubbly. Pressure and urgency  How long have you been sick? She was seen last Thursday. Finished her meds yesterday  Have you been seen for this problem? yes  If your provider decides to give you a prescription, which pharmacy [facility] you [facility] [place]   Patient informed that this information will be sent to the clinical staff for review and that they should receive a follow up call.

## 2017-02-08 NOTE — Telephone Encounter (Signed)
I sent in the requested prescription 

## 2017-02-14 ENCOUNTER — Telehealth: Payer: Self-pay | Admitting: Pediatrics

## 2017-02-14 NOTE — Telephone Encounter (Signed)
Patient aware, script is ready. 

## 2017-02-14 NOTE — Telephone Encounter (Signed)
It looks like her culture did grow Escherichia coli that was resistant to Cipro, call in doxycycline 100 mg twice a day for 7 days

## 2017-02-14 NOTE — Telephone Encounter (Signed)
We need to see what urine looks like

## 2017-02-14 NOTE — Telephone Encounter (Signed)
Pt is at the beach for 2 weeks = please address antibiotic.

## 2017-02-14 NOTE — Telephone Encounter (Signed)
Patient was seen by Dr. Evette Doffing and given Cipro for bladder infection with out resolve.  She continues with frequency, pressure and pain.  On vacation for two weeks at beach.  [phone].  Her urologist has moved to [place] and not available.  Call her cell [phone] to let her know when done.

## 2017-03-08 ENCOUNTER — Other Ambulatory Visit: Payer: Self-pay | Admitting: Family

## 2017-03-08 DIAGNOSIS — E559 Vitamin D deficiency, unspecified: Secondary | ICD-10-CM

## 2017-03-09 NOTE — Telephone Encounter (Signed)
Last Vit D 03/19/16  22.4

## 2017-03-17 ENCOUNTER — Encounter: Payer: Self-pay | Admitting: Nurse Practitioner

## 2017-03-17 ENCOUNTER — Ambulatory Visit (INDEPENDENT_AMBULATORY_CARE_PROVIDER_SITE_OTHER): Payer: BLUE CROSS/BLUE SHIELD | Admitting: Nurse Practitioner

## 2017-03-17 VITALS — HR 73 | Temp 98.0°F | Ht 59.0 in | Wt 154.0 lb

## 2017-03-17 DIAGNOSIS — N3001 Acute cystitis with hematuria: Secondary | ICD-10-CM

## 2017-03-17 DIAGNOSIS — R300 Dysuria: Secondary | ICD-10-CM | POA: Diagnosis not present

## 2017-03-17 MED ORDER — CEFTRIAXONE SODIUM 1 G IJ SOLR
1.0000 g | Freq: Once | INTRAMUSCULAR | Status: AC
  Administered 2017-03-17: 1 g via INTRAMUSCULAR

## 2017-03-17 NOTE — Progress Notes (Signed)
   Subjective:    Patient ID: Amber Winters, female    DOB: 1956/01/12, 61 y.o.   MRN: [id number]  HPI Patient in the office with complaints of burning, urgency, and blood on the toilet paper when she wiped this morning.  She says her urine was cloudy this morning on urination.  Patient had a urethral sling placed in November 2014.  She was seen in the office 02/03/17 for a UTI and given Macrobid.  She states she had some relief of symptoms, but within a few days her symptoms had returned.  She called the office and was given cipro and states that with this medication her symptoms did not improve and that she had severe joint pain.  Then she called in and was given doxycycline and this caused her to be sick on her stomach.       Review of Systems  Genitourinary: Positive for dysuria, frequency, hematuria (blood when wiping) and urgency.       Objective:   Physical Exam  Constitutional: She appears well-developed and well-nourished. No distress.  Cardiovascular: Normal rate, regular rhythm and normal heart sounds.   Pulmonary/Chest: Effort normal and breath sounds normal. No respiratory distress.  Genitourinary: No vaginal discharge found.  Psychiatric: She has a normal mood and affect. Her behavior is normal. Judgment and thought content normal.   Pulse 73   Temp 98 F (36.7 C) (Oral)   Ht 4\' 11"  (1.499 m)   Wt 154 lb (69.9 kg)   BMI 31.10 kg/m      Assessment & Plan:  1. Dysuria - Urinalysis, Complete - Urine Culture  2. Acute cystitis with hematuria Decided to do rocephin due to problems with oral meds  Cotton underwear Take shower not bath Cranberry juice, yogurt Force fluids AZO over the counter X2 days Culture pending RTO prn  - cefTRIAXone (ROCEPHIN) injection 1 g; Inject 1 g into the muscle once.  Mary-Margaret Hassell Done, FNP

## 2017-03-17 NOTE — Patient Instructions (Signed)

## 2017-03-18 LAB — URINALYSIS, COMPLETE
BILIRUBIN UA: NEGATIVE
Glucose, UA: NEGATIVE
KETONES UA: NEGATIVE
Nitrite, UA: NEGATIVE
SPEC GRAV UA: 1.025 (ref 1.005–1.030)
Urobilinogen, Ur: 0.2 mg/dL (ref 0.2–1.0)
pH, UA: 5.5 (ref 5.0–7.5)

## 2017-03-18 LAB — MICROSCOPIC EXAMINATION
RBC, UA: >30 /hpf — ABNORMAL HIGH (ref 0–?)
WBC, UA: >30 /hpf — ABNORMAL HIGH (ref 0–?)

## 2017-03-19 LAB — URINE CULTURE

## 2017-03-21 ENCOUNTER — Other Ambulatory Visit: Payer: Self-pay | Admitting: Nurse Practitioner

## 2017-03-21 MED ORDER — NITROFURANTOIN MONOHYD MACRO 100 MG PO CAPS: 100.0000 mg | ORAL_CAPSULE | Freq: Two times a day (BID) | ORAL | 0 refills | Status: DC

## 2017-04-23 ENCOUNTER — Other Ambulatory Visit: Payer: Self-pay | Admitting: Family

## 2017-07-15 ENCOUNTER — Other Ambulatory Visit: Payer: Self-pay | Admitting: Nurse Practitioner

## 2017-07-15 DIAGNOSIS — E559 Vitamin D deficiency, unspecified: Secondary | ICD-10-CM

## 2017-07-15 NOTE — Telephone Encounter (Signed)
ntbs for refill 

## 2017-07-20 ENCOUNTER — Ambulatory Visit: Payer: BLUE CROSS/BLUE SHIELD | Admitting: Physician Assistant

## 2017-07-20 ENCOUNTER — Encounter: Payer: Self-pay | Admitting: Physician Assistant

## 2017-07-20 ENCOUNTER — Other Ambulatory Visit: Payer: Self-pay | Admitting: *Deleted

## 2017-07-20 VITALS — BP 146/90 | HR 77 | Temp 97.1°F | Ht 59.0 in | Wt 162.0 lb

## 2017-07-20 DIAGNOSIS — R300 Dysuria: Secondary | ICD-10-CM | POA: Diagnosis not present

## 2017-07-20 DIAGNOSIS — N3001 Acute cystitis with hematuria: Secondary | ICD-10-CM

## 2017-07-20 LAB — URINALYSIS, COMPLETE
BILIRUBIN UA: NEGATIVE
Glucose, UA: NEGATIVE
Ketones, UA: NEGATIVE
Nitrite, UA: NEGATIVE
PH UA: 5 (ref 5.0–7.5)
Specific Gravity, UA: >1.03 — ABNORMAL HIGH (ref 1.005–1.030)
Urobilinogen, Ur: 0.2 mg/dL (ref 0.2–1.0)

## 2017-07-20 LAB — MICROSCOPIC EXAMINATION

## 2017-07-20 MED ORDER — NITROFURANTOIN MONOHYD MACRO 100 MG PO CAPS: 100.0000 mg | ORAL_CAPSULE | Freq: Two times a day (BID) | ORAL | 0 refills | Status: DC

## 2017-07-20 NOTE — Progress Notes (Signed)
BP (!) 146/90   Pulse 77   Temp (!) 97.1 F (36.2 C)   Ht 4\' 11"  (1.499 m)   Wt 162 lb (73.5 kg)   BMI 32.72 kg/m    Subjective:    Patient ID: Amber Winters, female    DOB: 1955/09/14, 61 y.o.   MRN: [id number]  HPI: Amber Winters is a 61 y.o. female presenting on 07/20/2017 for pressure in abdomen, burning with voiding This patient has had 2 days of dysuria, frequency and nocturia. There is also pain over the bladder in the suprapubic region, no back pain. Denies leakage or hematuria.  Denies fever or chills. No pain in flank area. She has had a history of very complicated urinary tract infections over this past summer.  Her bacteria were even resistant to several antibiotics.  She ultimately ended up with a Rocephin injection for treatment.  We had a long discussion about recurrent urinary tract infections.  If she continues to have this we discussed possible low-dose antibiotic therapy daily or referral to Dr. Wendy Poet with [facility].   Relevant past medical, surgical, family and social history reviewed and updated as indicated. Allergies and medications reviewed and updated.  Past Medical History:  Diagnosis Date  . Hyperlipidemia     Past Surgical History:  Procedure Laterality Date  . ABDOMINAL HYSTERECTOMY    . CATARACT EXTRACTION, BILATERAL    . CHOLECYSTECTOMY    . HERNIA REPAIR      Review of Systems  Constitutional: Negative.   HENT: Negative.   Eyes: Negative.   Respiratory: Negative.   Gastrointestinal: Negative.   Genitourinary: Positive for difficulty urinating, dysuria, frequency, hematuria and urgency. Negative for flank pain.    Allergies as of 07/20/2017      Reactions   Ciprofloxacin Other (See Comments)   Patient states this causes severe muscle aching and discomfort.   Asa [aspirin]    Tinnitis   Zithromax [azithromycin]    Abdominal cramps      Medication List        Accurate as of 07/20/17 12:31 PM. Always use your most recent med  list.          atorvastatin 20 MG tablet Commonly known as:  LIPITOR TAKE 1 TABLET (20 MG TOTAL) BY MOUTH DAILY.   fish oil-omega-3 fatty acids 1000 MG capsule Take 2 g by mouth daily.   magnesium oxide 400 MG tablet Commonly known as:  MAG-OX Take 400 mg by mouth daily.   nitrofurantoin (macrocrystal-monohydrate) 100 MG capsule Commonly known as:  MACROBID Take 1 capsule (100 mg total) by mouth 2 (two) times daily. 1 po BId   ranitidine 75 MG tablet Commonly known as:  ZANTAC Take 75 mg by mouth as needed for heartburn.   Vitamin D (Ergocalciferol) 50000 units Caps capsule Commonly known as:  DRISDOL TAKE 1 CAPSULE (50,000 UNITS TOTAL) BY MOUTH EVERY 7 (SEVEN) DAYS.          Objective:    BP (!) 146/90   Pulse 77   Temp (!) 97.1 F (36.2 C)   Ht 4\' 11"  (1.499 m)   Wt 162 lb (73.5 kg)   BMI 32.72 kg/m   Allergies  Allergen Reactions  . Ciprofloxacin Other (See Comments)    Patient states this causes severe muscle aching and discomfort.  [name] [Aspirin]     Tinnitis  . Zithromax [Azithromycin]     Abdominal cramps    Physical Exam  Constitutional: She is oriented to  person, place, and time. She appears well-developed and well-nourished.  HENT:  Head: Normocephalic and atraumatic.  Eyes: Conjunctivae are normal. Pupils are equal, round, and reactive to light.  Cardiovascular: Normal rate, regular rhythm, normal heart sounds and intact distal pulses.  Pulmonary/Chest: Effort normal and breath sounds normal.  Abdominal: Soft. Bowel sounds are normal. She exhibits no distension and no mass. There is tenderness in the suprapubic area. There is no rebound, no guarding and no CVA tenderness.  Neurological: She is alert and oriented to person, place, and time. She has normal reflexes.  Skin: Skin is warm and dry. No rash noted.  Psychiatric: She has a normal mood and affect. Her behavior is normal. Judgment and thought content normal.        Assessment &  Plan:   1. Acute cystitis with hematuria macrobid 100 mg 1 BID 7 days Patient does not have improvement in 2 days, she is to call our office for a Rocephin 1 g injection.  Last culture showed E. coli sensitive to Macrobid and Rocephin.    Current Outpatient Medications:  .  atorvastatin (LIPITOR) 20 MG tablet, TAKE 1 TABLET (20 MG TOTAL) BY MOUTH DAILY., Disp: 90 tablet, Rfl: 1 .  fish oil-omega-3 fatty acids 1000 MG capsule, Take 2 g by mouth daily., Disp: , Rfl:  .  magnesium oxide (MAG-OX) 400 MG tablet, Take 400 mg by mouth daily., Disp: , Rfl:  .  nitrofurantoin, macrocrystal-monohydrate, (MACROBID) 100 MG capsule, Take 1 capsule (100 mg total) by mouth 2 (two) times daily. 1 po BId, Disp: 14 capsule, Rfl: 0 .  ranitidine (ZANTAC) 75 MG tablet, Take 75 mg by mouth as needed for heartburn., Disp: , Rfl:  .  Vitamin D, Ergocalciferol, (DRISDOL) 50000 units CAPS capsule, TAKE 1 CAPSULE (50,000 UNITS TOTAL) BY MOUTH EVERY 7 (SEVEN) DAYS., Disp: 12 capsule, Rfl: 0 Continue all other maintenance medications as listed above.  Follow up plan: No Follow-up on file.  Educational handout given for [place] PA-C [facility] [address]  [place], [place] [address] [phone]   07/20/2017, 12:31 PM

## 2017-07-20 NOTE — Patient Instructions (Signed)
In a few days you may receive a survey in the mail or online from [employer] regarding your visit with us today. Please take a moment to fill this out. Your feedback is very important to our whole office. It can help us better understand your needs as well as improve your experience and satisfaction. Thank you for taking your time to complete it. We care about you.  Damaso Laday, PA-C  

## 2017-07-23 LAB — URINE CULTURE

## 2017-10-20 ENCOUNTER — Telehealth: Payer: Self-pay | Admitting: Physician Assistant

## 2017-10-20 ENCOUNTER — Other Ambulatory Visit: Payer: Self-pay | Admitting: *Deleted

## 2017-10-20 NOTE — Telephone Encounter (Signed)
What symptoms do you have? Hurting in her side and urine is bubbly and has oder  How long have you been sick? 2 weeks  Have you been seen for this problem? Yes  If your provider decides to give you a prescription, which pharmacy would you like for it to be sent to? [facility] [place]   Patient informed that this information will be sent to the clinical staff for review and that they should receive a follow up call.

## 2017-10-20 NOTE — Telephone Encounter (Signed)
You are her PCP, last seen by you 02/2017

## 2017-10-20 NOTE — Telephone Encounter (Signed)
appt 10/21/17

## 2017-10-20 NOTE — Telephone Encounter (Signed)
Will either need to be seen or can do evisit on line

## 2017-10-21 ENCOUNTER — Ambulatory Visit: Payer: BLUE CROSS/BLUE SHIELD | Admitting: Family Medicine

## 2017-10-21 ENCOUNTER — Encounter (INDEPENDENT_AMBULATORY_CARE_PROVIDER_SITE_OTHER): Payer: Self-pay

## 2017-10-21 VITALS — BP 134/82 | HR 78 | Temp 98.0°F | Ht 59.0 in | Wt 162.0 lb

## 2017-10-21 DIAGNOSIS — N3000 Acute cystitis without hematuria: Secondary | ICD-10-CM | POA: Diagnosis not present

## 2017-10-21 DIAGNOSIS — R109 Unspecified abdominal pain: Secondary | ICD-10-CM | POA: Diagnosis not present

## 2017-10-21 DIAGNOSIS — R8290 Unspecified abnormal findings in urine: Secondary | ICD-10-CM | POA: Diagnosis not present

## 2017-10-21 LAB — URINALYSIS, COMPLETE
BILIRUBIN UA: NEGATIVE
Glucose, UA: NEGATIVE
KETONES UA: NEGATIVE
Nitrite, UA: NEGATIVE
PROTEIN UA: NEGATIVE
RBC UA: NEGATIVE
SPEC GRAV UA: 1.025 (ref 1.005–1.030)
Urobilinogen, Ur: 0.2 mg/dL (ref 0.2–1.0)
pH, UA: 5.5 (ref 5.0–7.5)

## 2017-10-21 LAB — MICROSCOPIC EXAMINATION: RENAL EPITHEL UA: NONE SEEN /HPF

## 2017-10-21 MED ORDER — CEFTRIAXONE SODIUM 1 G IJ SOLR
1.0000 g | Freq: Once | INTRAMUSCULAR | Status: AC
  Administered 2017-10-21: 1 g via INTRAMUSCULAR

## 2017-10-21 MED ORDER — CEPHALEXIN 500 MG PO CAPS: 500.0000 mg | ORAL_CAPSULE | Freq: Four times a day (QID) | ORAL | 0 refills | Status: AC

## 2017-10-21 NOTE — Patient Instructions (Signed)
You are given a dose of Rocephin here in office.  You may start your oral antibiotic after lunch.  You should be able to get in at least 2 doses today.  You do not have to take this medication with food but you may if you wish to.  Try and drink plenty of water.  If your symptoms worsen, you develop high fevers, gross blood in your urine, severe abdominal pain, nausea, vomiting please seek immediate medical attention.   Urinary Tract Infection, Adult A urinary tract infection (UTI) is an infection of any part of the urinary tract. The urinary tract includes the:  Kidneys.  Ureters.  Bladder.  Urethra.  These organs make, store, and get rid of pee (urine) in the body. Follow these instructions at home:  Take over-the-counter and prescription medicines only as told by your doctor.  If you were prescribed an antibiotic medicine, take it as told by your doctor. Do not stop taking the antibiotic even if you start to feel better.  Avoid the following drinks: ? Alcohol. ? Caffeine. ? Tea. ? Carbonated drinks.  Drink enough fluid to keep your pee clear or pale yellow.  Keep all follow-up visits as told by your doctor. This is important.  Make sure to: ? Empty your bladder often and completely. Do not to hold pee for long periods of time. ? Empty your bladder before and after sex. ? Wipe from front to back after a bowel movement if you are female. Use each tissue one time when you wipe. Contact a doctor if:  You have back pain.  You have a fever.  You feel sick to your stomach (nauseous).  You throw up (vomit).  Your symptoms do not get better after 3 days.  Your symptoms go away and then come back. Get help right away if:  You have very bad back pain.  You have very bad lower belly (abdominal) pain.  You are throwing up and cannot keep down any medicines or water. This information is not intended to replace advice given to you by your health care provider. Make sure you  discuss any questions you have with your health care provider. Document Released: 01/26/2008 Document Revised: 01/15/2016 Document Reviewed: 06/30/2015 Elsevier Interactive Patient Education  [employer].

## 2017-10-21 NOTE — Progress Notes (Signed)
Subjective: CC: UTI PCP: Chevis Pretty, FNP HPI:Amber Winters is a 62 y.o. female presenting to clinic today for:  1. Urinary symptoms Patient reports a 2 day h/o urinary odor, right flank pain and bladder pressure.  Denies urinary frequency, urgency, hematuria, fevers, chills, nausea, vomiting, back pain, vaginal discharge.  Patient has used Motrin for symptoms.  She took a home urinalysis which did demonstrate urinary tract infection.  Patient reports a h/o frequent or recurrent UTIs over the last year.  She notes that her medical history significant for hysterectomy when she was younger.  She had urinary incontinence for several years before she saw urology.  She actually had a bladder sling performed about 5 years ago with improvement in urinary symptoms.  However, over the last year she has had increased urinary tract infections.  No personal history of renal stones.  Last urinary tract infection was in November, which grew Klebsiella that was sensitive to cephalosporins.  Family history significant for renal stones in her mother.  ROS: Per HPI  Allergies  Allergen Reactions  . Ciprofloxacin Other (See Comments)    Patient states this causes severe muscle aching and discomfort.  [name] [Aspirin]     Tinnitis  . Zithromax [Azithromycin]     Abdominal cramps   Past Medical History:  Diagnosis Date  . Hyperlipidemia     Current Outpatient Medications:  .  atorvastatin (LIPITOR) 20 MG tablet, TAKE 1 TABLET (20 MG TOTAL) BY MOUTH DAILY., Disp: 90 tablet, Rfl: 1 .  fish oil-omega-3 fatty acids 1000 MG capsule, Take 2 g by mouth daily., Disp: , Rfl:  .  magnesium oxide (MAG-OX) 400 MG tablet, Take 400 mg by mouth daily., Disp: , Rfl:  .  ranitidine (ZANTAC) 75 MG tablet, Take 75 mg by mouth as needed for heartburn., Disp: , Rfl:  Social History   Socioeconomic History  . Marital status: Married    Spouse name: Not on file  . Number of children: Not on file  . Years of  education: Not on file  . Highest education level: Not on file  Social Needs  . Financial resource strain: Not on file  . Food insecurity - worry: Not on file  . Food insecurity - inability: Not on file  . Transportation needs - medical: Not on file  . Transportation needs - non-medical: Not on file  Occupational History  . Not on file  Tobacco Use  . Smoking status: Never Smoker  . Smokeless tobacco: Never Used  Substance and Sexual Activity  . Alcohol use: No  . Drug use: No  . Sexual activity: Not on file  Other Topics Concern  . Not on file  Social History Narrative  . Not on file   Family History  Problem Relation Age of Onset  . Cancer Mother   . Anuerysm Father   . Hypertension Brother     Objective: Office vital signs reviewed. BP 134/82   Pulse 78   Temp 98 F (36.7 C)   Ht 4\' 11"  (1.499 m)   Wt 162 lb (73.5 kg)   BMI 32.72 kg/m   Physical Examination:  General: Awake, alert, well nourished, No acute distress MSK: Mild right sided CVA tenderness.  Suprapubic tenderness to palpation present.  Assessment/ Plan: 62 y.o. female   1. Acute cystitis without hematuria She is afebrile and well-appearing on today's exam.  Urinalysis with 1+ leukocytes.  Urine microscopy with 6-10 white blood cells, few bacteria.  This was sent  for urine culture.  Given her right sided flank pain, patient was given a dose of Rocephin 1 g here in office and discharged with Keflex 500 mg 4 times daily for the next 7 days to cover for possible pyelonephritis.  Push oral fluids.  Home care instructions reviewed with patient handout was provided. Strict return precautions and reasons for emergent evaluation in the emergency department review with patient.  They voiced understanding and will follow-up as needed. - cefTRIAXone (ROCEPHIN) injection 1 g - Urine Culture  2. Abnormal urine odor - Urinalysis, Complete  3. Right flank pain - Urine Culture   Orders Placed This Encounter    Procedures  . Urine Culture  . Urinalysis, Complete   Meds ordered this encounter  Medications  . cefTRIAXone (ROCEPHIN) injection 1 g  . cephALEXin (KEFLEX) 500 MG capsule    Sig: Take 1 capsule (500 mg total) by mouth 4 (four) times daily for 7 days.    Dispense:  28 capsule    Refill:  McKenzie, DO [place] [phone]

## 2017-10-22 LAB — URINE CULTURE

## 2017-10-24 ENCOUNTER — Other Ambulatory Visit: Payer: Self-pay | Admitting: Family

## 2017-12-05 ENCOUNTER — Encounter: Payer: Self-pay | Admitting: Family

## 2017-12-05 ENCOUNTER — Ambulatory Visit: Payer: BLUE CROSS/BLUE SHIELD | Admitting: Family

## 2017-12-05 VITALS — BP 138/86 | HR 96 | Temp 98.3°F | Ht 59.0 in | Wt 163.8 lb

## 2017-12-05 DIAGNOSIS — N3001 Acute cystitis with hematuria: Secondary | ICD-10-CM

## 2017-12-05 DIAGNOSIS — R1031 Right lower quadrant pain: Secondary | ICD-10-CM | POA: Diagnosis not present

## 2017-12-05 LAB — MICROSCOPIC EXAMINATION

## 2017-12-05 LAB — URINALYSIS, COMPLETE
BILIRUBIN UA: NEGATIVE
Glucose, UA: NEGATIVE
Ketones, UA: NEGATIVE
NITRITE UA: NEGATIVE
PH UA: 5.5 (ref 5.0–7.5)
Protein, UA: NEGATIVE
Specific Gravity, UA: >1.03 — ABNORMAL HIGH (ref 1.005–1.030)
UUROB: 0.2 mg/dL (ref 0.2–1.0)

## 2017-12-05 MED ORDER — SULFAMETHOXAZOLE-TRIMETHOPRIM 800-160 MG PO TABS: 1.0000 | ORAL_TABLET | Freq: Two times a day (BID) | ORAL | 0 refills | Status: DC

## 2017-12-05 NOTE — Patient Instructions (Signed)

## 2017-12-05 NOTE — Progress Notes (Signed)
   Subjective:    Patient ID: Evalena Fujii, female    DOB: Oct 31, 1955, 62 y.o.   MRN: [id number]  Urinary Tract Infection   This is a new problem. The current episode started 1 to 4 weeks ago. The problem occurs intermittently. The problem has been waxing and waning. The quality of the pain is described as burning. The pain is at a severity of 6/10. The pain is mild. Associated symptoms include frequency, hesitancy, nausea and urgency. Pertinent negatives include no hematuria or vomiting. She has tried increased fluids for the symptoms. The treatment provided mild relief.      Review of Systems  Gastrointestinal: Positive for nausea. Negative for vomiting.  Genitourinary: Positive for frequency, hesitancy and urgency. Negative for hematuria.  All other systems reviewed and are negative.      Objective:   Physical Exam  Constitutional: She is oriented to person, place, and time. She appears well-developed and well-nourished. No distress.  HENT:  Head: Normocephalic.  Eyes: Pupils are equal, round, and reactive to light.  Neck: Normal range of motion. Neck supple. No thyromegaly present.  Cardiovascular: Normal rate, regular rhythm, normal heart sounds and intact distal pulses.  No murmur heard. Pulmonary/Chest: Effort normal and breath sounds normal. No respiratory distress. She has no wheezes.  Abdominal: Soft. Bowel sounds are normal. She exhibits no distension. There is no tenderness.  Musculoskeletal: Normal range of motion. She exhibits no edema or tenderness.  Neurological: She is alert and oriented to person, place, and time.  Skin: Skin is warm and dry.  Psychiatric: She has a normal mood and affect. Her behavior is normal. Judgment and thought content normal.  Vitals reviewed.    BP 138/86   Pulse 96   Temp 98.3 F (36.8 C) (Oral)   Ht 4\' 11"  (1.499 m)   Wt 163 lb 12.8 oz (74.3 kg)   BMI 33.08 kg/m      Assessment & Plan:  1. Right lower quadrant abdominal pain -  Urinalysis, Complete  2. Acute cystitis with hematuria Force fluids AZO over the counter X2 days RTO prn Culture pending - sulfamethoxazole-trimethoprim (BACTRIM DS) 800-160 MG tablet; Take 1 tablet by mouth 2 (two) times daily.  Dispense: 14 tablet; Refill: 0   Evelina Dun, FNP

## 2017-12-15 ENCOUNTER — Telehealth: Payer: Self-pay | Admitting: Nurse Practitioner

## 2017-12-15 ENCOUNTER — Other Ambulatory Visit: Payer: Self-pay | Admitting: *Deleted

## 2017-12-15 ENCOUNTER — Other Ambulatory Visit: Payer: BLUE CROSS/BLUE SHIELD

## 2017-12-15 DIAGNOSIS — R399 Unspecified symptoms and signs involving the genitourinary system: Secondary | ICD-10-CM

## 2017-12-15 LAB — URINALYSIS, COMPLETE
Bilirubin, UA: NEGATIVE
Glucose, UA: NEGATIVE
Ketones, UA: NEGATIVE
NITRITE UA: NEGATIVE
PH UA: 5 (ref 5.0–7.5)
Specific Gravity, UA: >1.03 — ABNORMAL HIGH (ref 1.005–1.030)
UUROB: 0.2 mg/dL (ref 0.2–1.0)

## 2017-12-15 LAB — MICROSCOPIC EXAMINATION
Epithelial Cells (non renal): >10 /hpf — AB (ref 0–10)
Renal Epithel, UA: NONE SEEN /hpf

## 2017-12-15 NOTE — Telephone Encounter (Signed)
Patient aware.  Orders placed.

## 2017-12-15 NOTE — Telephone Encounter (Signed)
Ok to come in and leave urine

## 2017-12-17 LAB — URINE CULTURE

## 2017-12-23 ENCOUNTER — Encounter: Payer: Self-pay | Admitting: Nurse Practitioner

## 2017-12-23 ENCOUNTER — Ambulatory Visit: Payer: BLUE CROSS/BLUE SHIELD | Admitting: Nurse Practitioner

## 2017-12-23 VITALS — BP 153/95 | HR 79 | Temp 97.6°F | Ht 59.0 in | Wt 163.0 lb

## 2017-12-23 DIAGNOSIS — N816 Rectocele: Secondary | ICD-10-CM | POA: Diagnosis not present

## 2017-12-23 DIAGNOSIS — N8110 Cystocele, unspecified: Secondary | ICD-10-CM | POA: Diagnosis not present

## 2017-12-23 DIAGNOSIS — N390 Urinary tract infection, site not specified: Secondary | ICD-10-CM

## 2017-12-23 LAB — URINALYSIS, COMPLETE
Bilirubin, UA: NEGATIVE
GLUCOSE, UA: NEGATIVE
Ketones, UA: NEGATIVE
Leukocytes, UA: NEGATIVE
NITRITE UA: NEGATIVE
Protein, UA: NEGATIVE
RBC, UA: NEGATIVE
Specific Gravity, UA: 1.025 (ref 1.005–1.030)
Urobilinogen, Ur: 0.2 mg/dL (ref 0.2–1.0)
pH, UA: 5 (ref 5.0–7.5)

## 2017-12-23 LAB — MICROSCOPIC EXAMINATION
RBC MICROSCOPIC, UA: NONE SEEN /HPF (ref 0–2)
RENAL EPITHEL UA: NONE SEEN /HPF

## 2017-12-23 NOTE — Patient Instructions (Signed)
About Rectocele  Overview  A rectocele is a type of hernia which causes different degrees of bulging of the rectal tissues into the vaginal wall.  You may even notice that it presses against the vaginal wall so much that some vaginal tissues droop outside of the opening of your vagina.  Causes of Rectocele  The most common cause is childbirth.  The muscles and ligaments in the pelvis that hold up and support the female organs and vagina become stretched and weakened during labor and delivery.  The more babies you have, the more the support tissues are stretched and weakened.  Not everyone who has a baby will develop a rectocele.  Some women have stronger supporting tissue in the pelvis and may not have as much of a problem as others.  Women who have a Cesarean section usually do not get rectocele's unless they pushed a long time prior to the cesarean delivery.  Other conditions that can cause a rectocele include chronic constipation, a chronic cough, a lot of heavy lifting, and obesity.  Older women may have this problem because the loss of female hormones causes the vaginal tissue to become weaker.  Symptoms  There may not be any symptoms.  If you do have symptoms, they may include:  Pelvic pressure in the rectal area  Protrusion of the lower part of the vagina through the opening of the vagina  Constipation and trapping of the stool, making it difficult to have a bowel movement.  In severe cases, you may have to press on the lower part of your vagina to help push the stool out of you rectum.  This is called splinting to empty.  Diagnosing Rectocele  Your health care provider will ask about your symptoms and perform a pelvic exam.  S/he will ask you to bear down, pushing like you are having a bowel movement so as to see how far the lower part of the vagina protrudes into the vagina and possible outside of the vagina.  Your provider will also ask you to contract the muscles of your pelvis  (like you are stopping the stream in the middle of urinating) to determine the strength of your pelvic muscles.  Your provider may also do a rectal exam.  Treatment Options  If you do not have any symptoms, no treatment may be necessary.  Other treatment options include:  Pelvic floor exercises: Contracting the muscles in your genital area may help strengthen your muscles and support the organs.  Be sure to get proper exercise instruction from you physical therapist.  A pessary (removealbe pelvic support device) sometimes helps rectocele symptoms.  Surgery: Surgical repair may be necessary. In some cases the uterus may need to be taken out ( a hysterectomy) as well.  There are many types of surgery for pelvic support problems.  Look for physicians who specialize in repair procedures.  You can take care of yourself by:  Treating and preventing constipation  Avoiding heavy lifting, and lifting correctly (with your legs, not with you waist or back)  Treating a chronic cough or bronchitis  Not smoking  avoiding too much weight gain  Doing pelvic floor exercises   2007, [employer] Doc.33 About Cystocele  Overview  The pelvic organs, including the bladder, are normally supported by pelvic floor muscles and ligaments.  When these muscles and ligaments are stretched, weakened or torn, the wall between the bladder and the vagina sags or herniates causing a prolapse, sometimes called a cystocele.  This   condition may cause discomfort and problems with emptying the bladder.  It can be present in various stages.  Some people are not aware of the changes.  Others may notice changes at the vaginal opening or a feeling of the bladder dropping outside the body.  Causes of a Cystocele  A cystocele is usually caused by muscle straining or stretching during childbirth.  In addition, cystocele is more common after menopause, because the hormone estrogen helps keep the elastic tissues  around the pelvic organs strong.  A cystocele is more likely to occur when levels of estrogen decrease.  Other causes include: heavy lifting, chronic coughing, previous pelvic surgery and obesity.  Symptoms  A bladder that has dropped from its normal position may cause: unwanted urine leakage (stress incontinence), frequent urination or urge to urinate, incomplete emptying of the bladder (not feeling bladder relief after emptying), pain or discomfort in the vagina, pelvis, groin, lower back or lower abdomen and frequent urinary tract infections.  Mild cases may not cause any symptoms.  Treatment Options  Pelvic floor (Kegel) exercises:  Strength training the muscles in your genital area  Behavioral changes: Treating and preventing constipation, taking time to empty your bladder properly, learning to lift properly and/or avoid heavy lifting when possible, stopping smoking, avoiding weight gain and treating a chronic cough or bronchitis.  A pessary: A vaginal support device is sometimes used to help pelvic support caused by muscle and ligament changes.  Surgery: Surgical repair may be necessary if symptoms cannot be managed with exercise, behavioral changes and a pessary.  Surgery is usually considered for severe cases.   2007, [employer] 

## 2017-12-23 NOTE — Progress Notes (Signed)
   Subjective:    Patient ID: Amber Winters, female    DOB: 1956-02-10, 62 y.o.   MRN: [id number]   Chief Complaint: Possible urinary tract infection  HPI: Patient has been battling UTI since last summer. She has dysuria and frequency for a few days. She also feels like that there is something hanging out of her vaginal area. She had a hysterectomy in 2000.     Review of Systems  Constitutional: Negative.   HENT: Negative.   Respiratory: Negative.   Cardiovascular: Negative.   Genitourinary: Positive for dysuria, frequency and urgency. Negative for pelvic pain.  Neurological: Negative.   Psychiatric/Behavioral: Negative.   All other systems reviewed and are negative.      Objective:   Physical Exam  Constitutional: She appears well-developed and well-nourished.  Cardiovascular: Normal rate.  Pulmonary/Chest: Effort normal.  Genitourinary:  Genitourinary Comments: Small cystocele and large rectcele  Skin: Skin is warm and dry.   BP (!) 153/95   Pulse 79   Temp 97.6 F (36.4 C) (Oral)   Ht 4\' 11"  (1.499 m)   Wt 163 lb (73.9 kg)   BMI 32.92 kg/m       Assessment & Plan:  Amber Winters in today with chief complaint of Urinary Tract Infection   1. Frequent UTI Urine clear - Urinalysis, Complete - Ambulatory referral to Urology  2. Cystocele with rectocele Will wait and see what urology says before we see [place], FNP

## 2018-01-24 ENCOUNTER — Ambulatory Visit: Payer: BLUE CROSS/BLUE SHIELD | Admitting: Physician Assistant

## 2018-01-24 ENCOUNTER — Encounter: Payer: Self-pay | Admitting: Physician Assistant

## 2018-01-24 VITALS — BP 151/93 | HR 87 | Temp 98.4°F | Ht 59.0 in | Wt 161.0 lb

## 2018-01-24 DIAGNOSIS — R300 Dysuria: Secondary | ICD-10-CM | POA: Diagnosis not present

## 2018-01-24 DIAGNOSIS — R635 Abnormal weight gain: Secondary | ICD-10-CM

## 2018-01-24 LAB — URINALYSIS
Bilirubin, UA: NEGATIVE
GLUCOSE, UA: NEGATIVE
Nitrite, UA: NEGATIVE
PH UA: 5 (ref 5.0–7.5)
Urobilinogen, Ur: 0.2 mg/dL (ref 0.2–1.0)

## 2018-01-24 MED ORDER — PHENTERMINE HCL 37.5 MG PO CAPS
37.5000 mg | ORAL_CAPSULE | ORAL | 1 refills | Status: DC
Start: 2018-01-24 — End: 2018-01-25

## 2018-01-24 MED ORDER — SULFAMETHOXAZOLE-TRIMETHOPRIM 800-160 MG PO TABS: 1.0000 | ORAL_TABLET | Freq: Two times a day (BID) | ORAL | 0 refills | Status: DC

## 2018-01-24 NOTE — Progress Notes (Signed)
BP (!) 151/93   Pulse 87   Temp 98.4 F (36.9 C) (Oral)   Ht 4\' 11"  (1.499 m)   Wt 161 lb (73 kg)   BMI 32.52 kg/m    Subjective:    Patient ID: Amber Winters, female    DOB: [birth year], 62 y.o.   MRN: [id number]  HPI: Amber Winters is a 62 y.o. female presenting on 01/24/2018 for No chief complaint on file.  This patient has had several days of dysuria, frequency and nocturia. There is also pain over the bladder in the suprapubic region, no back pain. Denies leakage or hematuria.  Denies fever or chills. No pain in flank area.  Also would like to work on weight loss and has taken phentermine in the past  Usually follows weight watchers.  Past Medical History:  Diagnosis Date  . Hyperlipidemia    Relevant past medical, surgical, family and social history reviewed and updated as indicated. Interim medical history since our last visit reviewed. Allergies and medications reviewed and updated. DATA REVIEWED: CHART IN EPIC  Family History reviewed for pertinent findings.  Review of Systems  Constitutional: Negative.   HENT: Negative.   Eyes: Negative.   Respiratory: Negative.   Gastrointestinal: Negative.   Genitourinary: Positive for difficulty urinating, dysuria and urgency. Negative for flank pain.    Allergies as of 01/24/2018      Reactions   Ciprofloxacin Other (See Comments)   Patient states this causes severe muscle aching and discomfort.   Asa [aspirin]    Tinnitis   Zithromax [azithromycin]    Abdominal cramps      Medication List        Accurate as of 01/24/18  6:42 PM. Always use your most recent med list.          atorvastatin 20 MG tablet Commonly known as:  LIPITOR TAKE 1 TABLET (20 MG TOTAL) BY MOUTH DAILY.   fish oil-omega-3 fatty acids 1000 MG capsule Take 2 g by mouth daily.   magnesium oxide 400 MG tablet Commonly known as:  MAG-OX Take 400 mg by mouth daily.   phentermine 37.5 MG capsule Take 1 capsule (37.5 mg total) by mouth every morning.   ranitidine 75 MG tablet Commonly known as:  ZANTAC Take 75 mg by mouth as needed for heartburn.   sulfamethoxazole-trimethoprim 800-160 MG tablet Commonly known as:  BACTRIM DS Take 1 tablet by mouth 2 (two) times daily.          Objective:    BP (!) 151/93   Pulse 87   Temp 98.4 F (36.9 C) (Oral)   Ht 4\' 11"  (1.499 m)   Wt 161 lb (73 kg)   BMI 32.52 kg/m   Allergies  Allergen Reactions  . Ciprofloxacin Other (See Comments)    Patient states this causes severe muscle aching and discomfort.  [name] [Aspirin]     Tinnitis  . Zithromax [Azithromycin]     Abdominal cramps    Wt Readings from Last 3 Encounters:  01/24/18 161 lb (73 kg)  12/23/17 163 lb (73.9 kg)  12/05/17 163 lb 12.8 oz (74.3 kg)    Physical Exam  Constitutional: She is oriented to person, place, and time. She appears well-developed and well-nourished.  HENT:  Head: Normocephalic and atraumatic.  Eyes: Pupils are equal, round, and reactive to light. Conjunctivae are normal.  Cardiovascular: Normal rate, regular rhythm, normal heart sounds and intact distal pulses.  Pulmonary/Chest: Effort normal and breath sounds normal.  Abdominal:  Soft. Bowel sounds are normal. She exhibits no distension and no mass. There is tenderness in the suprapubic area. There is no rebound, no guarding and no CVA tenderness.  Neurological: She is alert and oriented to person, place, and time. She has normal reflexes.  Skin: Skin is warm and dry. No rash noted.  Psychiatric: She has a normal mood and affect. Her behavior is normal. Judgment and thought content normal.    Results for orders placed or performed in visit on 01/24/18  Urinalysis  Result Value Ref Range   Specific Gravity, UA >1.030 (H) 1.005 - 1.030   pH, UA 5.0 5.0 - 7.5   Color, UA Yellow Yellow   Appearance Ur Cloudy (A) Clear   Leukocytes, UA 1+ (A) Negative   Protein, UA 1+ (A) Negative/Trace   Glucose, UA Negative Negative   Ketones, UA 1+ (A)  Negative   RBC, UA 2+ (A) Negative   Bilirubin, UA Negative Negative   Urobilinogen, Ur 0.2 0.2 - 1.0 mg/dL   Nitrite, UA Negative Negative      Assessment & Plan:   1. Dysuria - Urine Culture - Urinalysis - sulfamethoxazole-trimethoprim (BACTRIM DS) 800-160 MG tablet; Take 1 tablet by mouth 2 (two) times daily.  Dispense: 20 tablet; Refill: 0  2. Weight gain - phentermine 37.5 MG capsule; Take 1 capsule (37.5 mg total) by mouth every morning.  Dispense: 30 capsule; Refill: 1   Continue all other maintenance medications as listed above.  Follow up plan: Return in about 2 months (around 03/26/2018) for recheck.  Educational handout given for [employer] PA-C [employer] [address]  [place], [place] [address] [phone]   01/24/2018, 6:42 PM

## 2018-01-25 ENCOUNTER — Telehealth: Payer: Self-pay | Admitting: Nurse Practitioner

## 2018-01-25 MED ORDER — PHENTERMINE HCL 37.5 MG PO TABS: 37.5000 mg | ORAL_TABLET | Freq: Every day | ORAL | 1 refills | Status: DC

## 2018-01-25 NOTE — Telephone Encounter (Signed)
Patient states that when she seen Glenard Haring lastnight that she was wanted patient to get phentermine 37.5 mg but cut it in half.  When patient got her RX from [facility] it was written for capsules so she can not cut them in half. Wanting to know if a new rx needs to be sent in or if she should take the whole pill? Please advise

## 2018-01-25 NOTE — Telephone Encounter (Signed)
Patient would like for you to send in the tablets she can cut in half. Aware new rx will be sent per note.

## 2018-01-25 NOTE — Telephone Encounter (Signed)
I can send a different one if she wants, or can take the ones she got last night.  Either is okay.

## 2018-01-25 NOTE — Telephone Encounter (Signed)
Patient aware.

## 2018-01-25 NOTE — Telephone Encounter (Signed)
New script is sent.

## 2018-01-26 LAB — URINE CULTURE

## 2018-01-27 ENCOUNTER — Other Ambulatory Visit: Payer: Self-pay | Admitting: Physician Assistant

## 2018-01-27 ENCOUNTER — Ambulatory Visit (INDEPENDENT_AMBULATORY_CARE_PROVIDER_SITE_OTHER): Payer: BLUE CROSS/BLUE SHIELD | Admitting: *Deleted

## 2018-01-27 ENCOUNTER — Telehealth: Payer: Self-pay | Admitting: Nurse Practitioner

## 2018-01-27 DIAGNOSIS — R399 Unspecified symptoms and signs involving the genitourinary system: Secondary | ICD-10-CM

## 2018-01-27 MED ORDER — CEFTRIAXONE SODIUM 1 G IJ SOLR: 1.0000 g | Freq: Once | INTRAMUSCULAR | 0 refills | Status: DC

## 2018-01-27 MED ORDER — AMOXICILLIN-POT CLAVULANATE 875-125 MG PO TABS: 1.0000 | ORAL_TABLET | Freq: Two times a day (BID) | ORAL | 0 refills | Status: DC

## 2018-01-27 MED ORDER — CEFTRIAXONE SODIUM 1 G IJ SOLR
1.0000 g | Freq: Once | INTRAMUSCULAR | Status: AC
  Administered 2018-01-27: 1 g via INTRAMUSCULAR

## 2018-01-27 NOTE — Telephone Encounter (Signed)
Her culture was positive for klebsiella, rocephin 1 gram is appropriate treatment for this

## 2018-01-27 NOTE — Progress Notes (Signed)
Pt given Rocephin inj Tolerated well

## 2018-01-27 NOTE — Telephone Encounter (Signed)
Patient aware and appointment given.  

## 2018-01-27 NOTE — Telephone Encounter (Signed)
Patient said she is not able to take the antibiotic that was prescribed and would like something else sent in instead to [facility].  If possible, she can tolerated Rocephin injections if that would help.  She is having nausea and abdominal pain since starting this medication.  Please advise.

## 2018-02-07 ENCOUNTER — Other Ambulatory Visit: Payer: BLUE CROSS/BLUE SHIELD

## 2018-02-07 DIAGNOSIS — R399 Unspecified symptoms and signs involving the genitourinary system: Secondary | ICD-10-CM

## 2018-02-08 LAB — URINE CULTURE: Organism ID, Bacteria: NO GROWTH

## 2018-02-27 ENCOUNTER — Telehealth: Payer: Self-pay | Admitting: Nurse Practitioner

## 2018-02-27 MED ORDER — ATORVASTATIN CALCIUM 20 MG PO TABS: 20.0000 mg | ORAL_TABLET | Freq: Every day | ORAL | 1 refills | Status: DC

## 2018-02-27 NOTE — Telephone Encounter (Signed)
Ok to refill? Next appt 03/28/18

## 2018-03-09 ENCOUNTER — Encounter: Payer: Self-pay | Admitting: Physician Assistant

## 2018-03-09 ENCOUNTER — Ambulatory Visit: Payer: BLUE CROSS/BLUE SHIELD | Admitting: Physician Assistant

## 2018-03-09 VITALS — BP 126/77 | HR 97 | Temp 97.8°F | Ht 59.0 in | Wt 153.6 lb

## 2018-03-09 DIAGNOSIS — R21 Rash and other nonspecific skin eruption: Secondary | ICD-10-CM

## 2018-03-09 DIAGNOSIS — T7840XA Allergy, unspecified, initial encounter: Secondary | ICD-10-CM | POA: Diagnosis not present

## 2018-03-09 MED ORDER — PREDNISONE 10 MG (21) PO TBPK: ORAL_TABLET | ORAL | 0 refills | Status: DC

## 2018-03-09 MED ORDER — METHYLPREDNISOLONE ACETATE 80 MG/ML IJ SUSP
80.0000 mg | Freq: Once | INTRAMUSCULAR | Status: AC
  Administered 2018-03-09: 80 mg via INTRAMUSCULAR

## 2018-03-09 NOTE — Progress Notes (Signed)
BP 126/77 (BP Location: Left Arm, Patient Position: Sitting, Cuff Size: Normal)   Pulse 97   Temp 97.8 F (36.6 C) (Oral)   Ht 4\' 11"  (1.499 m)   Wt 153 lb 9.6 oz (69.7 kg)   SpO2 98%   BMI 31.02 kg/m    Subjective:    Patient ID: Amber Winters, female    DOB: 04-Feb-1956, 62 y.o.   MRN: [id number]  HPI: Amber Winters is a 62 y.o. female presenting on 03/09/2018 for Allergic Reaction (started yesterday, abd, back, head) This patient comes in with a 2-day reaction to her Macrobid.  She had been placed on this for urinary tract infection prevention.  She is allergic to Cipro and sulfa.  Starting yesterday she had significant itching to her trunk and then began having a red papular rash throughout her trunk chest and up into her hairline.  It is not on her arms or legs.  The itching was so severe that she almost went to the emergency room last night.  She denies any shortness of breath or wheezing.   Past Medical History:  Diagnosis Date  . Hyperlipidemia    Relevant past medical, surgical, family and social history reviewed and updated as indicated. Interim medical history since our last visit reviewed. Allergies and medications reviewed and updated. DATA REVIEWED: CHART IN EPIC  Family History reviewed for pertinent findings.  Review of Systems  Constitutional: Negative.  Negative for activity change, fatigue and fever.  HENT: Negative.   Eyes: Negative.   Respiratory: Negative.  Negative for cough.   Cardiovascular: Negative.  Negative for chest pain.  Gastrointestinal: Negative.  Negative for abdominal pain.  Endocrine: Negative.   Genitourinary: Negative.  Negative for dysuria.  Musculoskeletal: Negative.   Skin: Positive for color change and rash.  Neurological: Negative.     Allergies as of 03/09/2018      Reactions   Ciprofloxacin Other (See Comments)   Patient states this causes severe muscle aching and discomfort.   Asa [aspirin]    Tinnitis   Sulfa Antibiotics    Zithromax [azithromycin]    Abdominal cramps      Medication List        Accurate as of 03/09/18  8:30 AM. Always use your most recent med list.          amoxicillin-clavulanate 875-125 MG tablet Commonly known as:  AUGMENTIN Take 1 tablet by mouth 2 (two) times daily.   atorvastatin 20 MG tablet Commonly known as:  LIPITOR Take 1 tablet (20 mg total) by mouth daily.   fish oil-omega-3 fatty acids 1000 MG capsule Take 2 g by mouth daily.   magnesium oxide 400 MG tablet Commonly known as:  MAG-OX Take 400 mg by mouth daily.   phentermine 37.5 MG tablet Commonly known as:  ADIPEX-P Take 1 tablet (37.5 mg total) by mouth daily before breakfast.   predniSONE 10 MG (21) Tbpk tablet Commonly known as:  STERAPRED UNI-PAK 21 TAB As directed x 6 days   ranitidine 75 MG tablet Commonly known as:  ZANTAC Take 75 mg by mouth as needed for heartburn.          Objective:    BP 126/77 (BP Location: Left Arm, Patient Position: Sitting, Cuff Size: Normal)   Pulse 97   Temp 97.8 F (36.6 C) (Oral)   Ht 4\' 11"  (1.499 m)   Wt 153 lb 9.6 oz (69.7 kg)   SpO2 98%   BMI 31.02 kg/m  Allergies  Allergen Reactions  . Ciprofloxacin Other (See Comments)    Patient states this causes severe muscle aching and discomfort.  [name] [Aspirin]     Tinnitis  . Sulfa Antibiotics   . Zithromax [Azithromycin]     Abdominal cramps    Wt Readings from Last 3 Encounters:  03/09/18 153 lb 9.6 oz (69.7 kg)  01/24/18 161 lb (73 kg)  12/23/17 163 lb (73.9 kg)    Physical Exam  Constitutional: She is oriented to person, place, and time. She appears well-developed and well-nourished.  HENT:  Head: Normocephalic and atraumatic.  Eyes: Pupils are equal, round, and reactive to light. Conjunctivae and EOM are normal.  Cardiovascular: Normal rate, regular rhythm, normal heart sounds and intact distal pulses.  Pulmonary/Chest: Effort normal and breath sounds normal.  Abdominal: Soft. Bowel  sounds are normal.  Neurological: She is alert and oriented to person, place, and time. She has normal reflexes.  Skin: Skin is warm and dry. Lesion and rash noted. Rash is papular. There is erythema.     Papular red rash throughout trunk, itching while in room  Psychiatric: She has a normal mood and affect. Her behavior is normal. Judgment and thought content normal.    Results for orders placed or performed in visit on 02/07/18  Urine Culture  Result Value Ref Range   Urine Culture, Routine Final report    Organism ID, Bacteria No growth       Assessment & Plan:   1. Allergic reaction to drug, initial encounter - methylPREDNISolone acetate (DEPO-MEDROL) injection 80 mg - Ambulatory referral to Allergy - predniSONE (STERAPRED UNI-PAK 21 TAB) 10 MG (21) TBPK tablet; As directed x 6 days  Dispense: 21 tablet; Refill: 0   Continue all other maintenance medications as listed above.  Follow up plan: No follow-ups on file.  Educational handout given for [place] PA-C [facility] [address]  [place], [place] [address] [phone]   03/09/2018, 8:30 AM

## 2018-03-09 NOTE — Patient Instructions (Addendum)
Continue your Zantac, it is a histamine blocker  Take Claritin in the morning Take Zyrtec in the evening Take Benadryl 50 mg (2 tablets) 4 times a day Take prednisone pack sent to the pharmacy  We will be working on an allergy appointment for you.  Call your urologist to let them know and see if they have something else for your bladder infections   Drug Rash A drug rash is a change in the color or texture of the skin that is caused by a drug. It can develop minutes, hours, or days after the person takes the drug. What are the causes? This condition is usually caused by a drug allergy. It can also be caused by exposure to sunlight after taking a drug that makes the skin sensitive to light. Drugs that commonly cause rashes include:  Penicillin.  Antibiotic medicines.  Medicines that treat seizures.  Medicines that treat cancer (chemotherapy).  Aspirin and other nonsteroidal anti-inflammatory drugs (NSAIDs).  Injectable dyes that contain iodine.  Insulin.  What are the signs or symptoms? Symptoms of this condition include:  Redness.  Tiny bumps.  Peeling.  Itching.  Itchy welts (hives).  Swelling.  The rash may appear on a small area of skin or all over the body. How is this diagnosed? To diagnose the condition, your health care provider will do a physical exam. He or she may also order tests to find out which drug caused the rash. Tests to find the cause of a rash include:  Skin tests.  Blood tests.  Drug challenge. For this test, you stop taking all of the drugs that you do not need to take, and then you start taking them again by adding back one of the drugs at a time.  How is this treated? A drug rash may be treated with medicines, including:  Antihistamines. These may be given to relieve itching.  An NSAID. This may be given to reduce swelling and treat pain.  A steroid drug. This may be given to reduce swelling.  The rash usually goes away when the  person stops taking the drug that caused it. Follow these instructions at home:  Take medicines only as directed by your health care provider.  Let all of your health care providers know about any drug reactions you have had in the past.  If you have hives, take a cool shower or use a cool compress to relieve itchiness. Contact a health care provider if:  You have a fever.  Your rash is not going away.  Your rash gets worse.  Your rash comes back.  You have wheezing or coughing. Get help right away if:  You start to have breathing problems.  You start to have shortness of breath.  You face or throat starts to swell.  You have severe weakness with dizziness or fainting.  You have chest pain. This information is not intended to replace advice given to you by your health care provider. Make sure you discuss any questions you have with your health care provider. Document Released: 09/16/2004 Document Revised: 01/15/2016 Document Reviewed: 06/05/2014 Elsevier Interactive Patient Education  [employer].

## 2018-03-10 ENCOUNTER — Telehealth: Payer: Self-pay | Admitting: Nurse Practitioner

## 2018-03-10 MED ORDER — TRIAMCINOLONE ACETONIDE 0.1 % EX CREA: 1.0000 "application " | TOPICAL_CREAM | Freq: Two times a day (BID) | CUTANEOUS | 0 refills | Status: DC

## 2018-03-10 NOTE — Telephone Encounter (Signed)
Pt aware.

## 2018-03-10 NOTE — Telephone Encounter (Signed)
Covering for PCP  Kenalog cream sent for itching.   Laroy Apple, MD [facility] 03/10/2018, 12:01 PM

## 2018-03-28 ENCOUNTER — Ambulatory Visit (INDEPENDENT_AMBULATORY_CARE_PROVIDER_SITE_OTHER): Payer: BLUE CROSS/BLUE SHIELD

## 2018-03-28 ENCOUNTER — Encounter: Payer: Self-pay | Admitting: Nurse Practitioner

## 2018-03-28 ENCOUNTER — Ambulatory Visit (INDEPENDENT_AMBULATORY_CARE_PROVIDER_SITE_OTHER): Payer: BLUE CROSS/BLUE SHIELD | Admitting: Nurse Practitioner

## 2018-03-28 VITALS — BP 135/90 | HR 101 | Temp 97.1°F | Ht 59.0 in | Wt 149.0 lb

## 2018-03-28 DIAGNOSIS — Z13820 Encounter for screening for osteoporosis: Secondary | ICD-10-CM

## 2018-03-28 DIAGNOSIS — N390 Urinary tract infection, site not specified: Secondary | ICD-10-CM

## 2018-03-28 DIAGNOSIS — K219 Gastro-esophageal reflux disease without esophagitis: Secondary | ICD-10-CM

## 2018-03-28 DIAGNOSIS — E559 Vitamin D deficiency, unspecified: Secondary | ICD-10-CM | POA: Diagnosis not present

## 2018-03-28 DIAGNOSIS — E785 Hyperlipidemia, unspecified: Secondary | ICD-10-CM | POA: Diagnosis not present

## 2018-03-28 DIAGNOSIS — Z780 Asymptomatic menopausal state: Secondary | ICD-10-CM | POA: Diagnosis not present

## 2018-03-28 DIAGNOSIS — Z1211 Encounter for screening for malignant neoplasm of colon: Secondary | ICD-10-CM

## 2018-03-28 LAB — CMP14+EGFR
ALT: 20 IU/L (ref 0–32)
AST: 23 IU/L (ref 0–40)
Albumin/Globulin Ratio: 1.8 (ref 1.2–2.2)
Albumin: 4.4 g/dL (ref 3.6–4.8)
Alkaline Phosphatase: 71 IU/L (ref 39–117)
BILIRUBIN TOTAL: 1.7 mg/dL — AB (ref 0.0–1.2)
BUN/Creatinine Ratio: 14 (ref 12–28)
BUN: 12 mg/dL (ref 8–27)
CHLORIDE: 101 mmol/L (ref 96–106)
CO2: 23 mmol/L (ref 20–29)
Calcium: 9.9 mg/dL (ref 8.7–10.3)
Creatinine, Ser: 0.83 mg/dL (ref 0.57–1.00)
GFR calc non Af Amer: 76 mL/min/{1.73_m2} (ref >59)
GFR, EST AFRICAN AMERICAN: 88 mL/min/{1.73_m2} (ref >59)
GLUCOSE: 81 mg/dL (ref 65–99)
Globulin, Total: 2.4 g/dL (ref 1.5–4.5)
POTASSIUM: 4.8 mmol/L (ref 3.5–5.2)
Sodium: 140 mmol/L (ref 134–144)
Total Protein: 6.8 g/dL (ref 6.0–8.5)

## 2018-03-28 LAB — URINALYSIS, COMPLETE
Bilirubin, UA: NEGATIVE
GLUCOSE, UA: NEGATIVE
Ketones, UA: NEGATIVE
Nitrite, UA: NEGATIVE
Protein, UA: NEGATIVE
Specific Gravity, UA: <1.005 — ABNORMAL LOW (ref 1.005–1.030)
Urobilinogen, Ur: 0.2 mg/dL (ref 0.2–1.0)
pH, UA: 5.5 (ref 5.0–7.5)

## 2018-03-28 LAB — MICROSCOPIC EXAMINATION
Bacteria, UA: NONE SEEN
RENAL EPITHEL UA: NONE SEEN /HPF

## 2018-03-28 LAB — LIPID PANEL
CHOLESTEROL TOTAL: 175 mg/dL (ref 100–199)
Chol/HDL Ratio: 2.7 ratio (ref 0.0–4.4)
HDL: 66 mg/dL (ref >39)
LDL Calculated: 90 mg/dL (ref 0–99)
TRIGLYCERIDES: 95 mg/dL (ref 0–149)
VLDL CHOLESTEROL CAL: 19 mg/dL (ref 5–40)

## 2018-03-28 NOTE — Progress Notes (Signed)
Subjective:    Patient ID: Amber Winters, female    DOB: 05/31/1956, 62 y.o.   MRN: [id number]   Chief Complaint: Medical Management of Chronic Issues   HPI:  1. Frequent UTI  Patient has had a uti intermittenty for the last 3 months. She curently denies any symptoms. She is currently on macrobid daily as prophylactic.  2. Gastroesophageal reflux disease, esophagitis presence not specified  She is on zantac as needed. Says that she needs most days.   3. Vitamin D deficiency  Takes daily vitamin d suplement  4. Hyperlipidemia with target LDL less than 100  Has not really been watching diet. Does not exercise much.    Outpatient Encounter Medications as of 03/28/2018  Medication Sig  . atorvastatin (LIPITOR) 20 MG tablet Take 1 tablet (20 mg total) by mouth daily.  . fish oil-omega-3 fatty acids 1000 MG capsule Take 2 g by mouth daily.  . magnesium oxide (MAG-OX) 400 MG tablet Take 400 mg by mouth daily.  . nitrofurantoin (MACRODANTIN) 100 MG capsule Take by mouth daily.  . phentermine (ADIPEX-P) 37.5 MG tablet Take 1 tablet (37.5 mg total) by mouth daily before breakfast.  . ranitidine (ZANTAC) 75 MG tablet Take 75 mg by mouth as needed for heartburn.  . triamcinolone cream (KENALOG) 0.1 % Apply 1 application topically 2 (two) times daily. Avoid face, groin, and underarms       New complaints: None today  Social history: Lives with husband. 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   Review of Systems  Constitutional: Negative for activity change and appetite change.  HENT: Negative.   Eyes: Negative for pain.  Respiratory: Negative for shortness of breath.   Cardiovascular: Negative for chest pain, palpitations and leg swelling.  Gastrointestinal: Negative for abdominal pain.  Endocrine: Negative for polydipsia.  Genitourinary: Negative.   Skin: Negative for rash.  Neurological: Negative for dizziness, weakness and headaches.  Hematological: Does not bruise/bleed  easily.  Psychiatric/Behavioral: Negative.   All other systems reviewed and are negative.      Objective:   Physical Exam  Constitutional: She is oriented to person, place, and time. She appears well-developed and well-nourished. No distress.  HENT:  Head: Normocephalic.  Nose: Nose normal.  Mouth/Throat: Oropharynx is clear and moist.  Eyes: Pupils are equal, round, and reactive to light. EOM are normal.  Neck: Normal range of motion. Neck supple. No JVD present. Carotid bruit is not present.  Cardiovascular: Normal rate, regular rhythm, normal heart sounds and intact distal pulses.  Pulmonary/Chest: Effort normal and breath sounds normal. No respiratory distress. She has no wheezes. She has no rales. She exhibits no tenderness.  Abdominal: Soft. Normal appearance, normal aorta and bowel sounds are normal. She exhibits no distension, no abdominal bruit, no pulsatile midline mass and no mass. There is no splenomegaly or hepatomegaly. There is no tenderness.  Musculoskeletal: Normal range of motion. She exhibits no edema.  Lymphadenopathy:    She has no cervical adenopathy.  Neurological: She is alert and oriented to person, place, and time. She has normal reflexes.  Skin: Skin is warm and dry.  Psychiatric: She has a normal mood and affect. Her behavior is normal. Judgment and thought content normal.  Nursing note and vitals reviewed.   BP 135/90   Pulse (!) 101   Temp (!) 97.1 F (36.2 C) (Oral)   Ht '4\' 11"'$  (1.499 m)   Wt 149 lb (67.6 kg)   BMI 30.09 kg/m  Assessment & Plan:  Ketzia Guzek comes in today with chief complaint of Medical Management of Chronic Issues   Diagnosis and orders addressed:  1. Frequent UTI Urine clear today - Urinalysis, Complete  2. Gastroesophageal reflux disease, esophagitis presence not specified Avoid spicy foods Do not eat 2 hours prior to bedtime  3. Vitamin D deficiency Continue daily supplement  4. Hyperlipidemia with target  LDL less than 100 Low fat diet - CMP14+EGFR - Lipid panel  5. Encounter for screening colonoscopy - Ambulatory referral to Gastroenterology  6. Screening for osteoporosis - DG [facility] DEXA   Labs pending Health Maintenance reviewed Diet and exercise encouraged  Follow up plan: 6 months   Mary-Margaret Hassell Done, FNP

## 2018-03-28 NOTE — Patient Instructions (Signed)

## 2018-03-31 ENCOUNTER — Other Ambulatory Visit: Payer: Self-pay | Admitting: Nurse Practitioner

## 2018-03-31 ENCOUNTER — Other Ambulatory Visit: Payer: Self-pay

## 2018-03-31 DIAGNOSIS — Z1239 Encounter for other screening for malignant neoplasm of breast: Secondary | ICD-10-CM

## 2018-04-11 ENCOUNTER — Ambulatory Visit: Payer: BLUE CROSS/BLUE SHIELD | Admitting: Allergy and Immunology

## 2018-05-26 LAB — HM COLONOSCOPY

## 2018-08-31 ENCOUNTER — Other Ambulatory Visit: Payer: Self-pay | Admitting: Nurse Practitioner

## 2018-10-28 ENCOUNTER — Encounter: Payer: Self-pay | Admitting: Family Medicine

## 2018-10-28 ENCOUNTER — Ambulatory Visit: Payer: BLUE CROSS/BLUE SHIELD | Admitting: Family Medicine

## 2018-10-28 VITALS — BP 143/87 | HR 94 | Temp 98.1°F | Ht 59.0 in | Wt 136.5 lb

## 2018-10-28 DIAGNOSIS — T700XXA Otitic barotrauma, initial encounter: Secondary | ICD-10-CM | POA: Diagnosis not present

## 2018-10-28 DIAGNOSIS — H9202 Otalgia, left ear: Secondary | ICD-10-CM

## 2018-10-28 DIAGNOSIS — H6992 Unspecified Eustachian tube disorder, left ear: Secondary | ICD-10-CM | POA: Diagnosis not present

## 2018-10-28 NOTE — Progress Notes (Signed)
Subjective: CC: Otalgia PCP: Chevis Pretty, FNP HPI:Amber Winters is a 63 y.o. female presenting to clinic today for:  1.  Otalgia Patient reports left-sided otalgia that is been ongoing for the last several days.  She recently flew back from a cruise.  She has had no fever, cough, congestion.  She is had very mild soreness along the left side of the neck that seems to be radiating from the left ear.  No ear drainage.  No known sick contacts.  She has taken various oral analgesics and attempts to improve symptoms and used a Zyrtec nasal swab to help with the some sinus congestion.   ROS: Per HPI  Allergies  Allergen Reactions  . Ciprofloxacin Other (See Comments)    Patient states this causes severe muscle aching and discomfort.  [name] [Aspirin]     Tinnitis  . Sulfa Antibiotics   . Zithromax [Azithromycin]     Abdominal cramps   Past Medical History:  Diagnosis Date  . Hyperlipidemia     Current Outpatient Medications:  .  atorvastatin (LIPITOR) 20 MG tablet, Take 1 tablet (20 mg total) by mouth daily. (Needs to be seen before next refill), Disp: 90 tablet, Rfl: 0 .  b complex vitamins tablet, Take 1 tablet by mouth daily., Disp: , Rfl:  .  Cholecalciferol (VITAMIN D-3 PO), Take by mouth., Disp: , Rfl:  .  fish oil-omega-3 fatty acids 1000 MG capsule, Take 2 g by mouth daily., Disp: , Rfl:  .  magnesium oxide (MAG-OX) 400 MG tablet, Take 400 mg by mouth daily., Disp: , Rfl:  .  nitrofurantoin (MACRODANTIN) 100 MG capsule, Take by mouth daily., Disp: , Rfl: 11 .  phentermine (ADIPEX-P) 37.5 MG tablet, Take 1 tablet (37.5 mg total) by mouth daily before breakfast., Disp: 30 tablet, Rfl: 1 Social History   Socioeconomic History  . Marital status: Married    Spouse name: Not on file  . Number of children: Not on file  . Years of education: Not on file  . Highest education level: Not on file  Occupational History  . Not on file  Social Needs  . Financial resource  strain: Not on file  . Food insecurity:    Worry: Not on file    Inability: Not on file  . Transportation needs:    Medical: Not on file    Non-medical: Not on file  Tobacco Use  . Smoking status: Never Smoker  . Smokeless tobacco: Never Used  Substance and Sexual Activity  . Alcohol use: No  . Drug use: No  . Sexual activity: Not on file  Lifestyle  . Physical activity:    Days per week: Not on file    Minutes per session: Not on file  . Stress: Not on file  Relationships  . Social connections:    Talks on phone: Not on file    Gets together: Not on file    Attends religious service: Not on file    Active member of club or organization: Not on file    Attends meetings of clubs or organizations: Not on file    Relationship status: Not on file  . Intimate partner violence:    Fear of current or ex partner: Not on file    Emotionally abused: Not on file    Physically abused: Not on file    Forced sexual activity: Not on file  Other Topics Concern  . Not on file  Social History Narrative  .  Not on file   Family History  Problem Relation Age of Onset  . Cancer Mother   . Anuerysm Father   . Hypertension Brother     Objective: Office vital signs reviewed. BP (!) 143/87   Pulse 94   Temp 98.1 F (36.7 C) (Oral)   Ht 4\' 11"  (1.499 m)   Wt 136 lb 8 oz (61.9 kg)   BMI 27.57 kg/m   Physical Examination:  General: Awake, alert, well nourished, No acute distress HEENT: Normal    Neck: No masses palpated. No lymphadenopathy    Ears: Tympanic membranes intact, normal light reflex, right TM with a small hemostatic bleed at the 12 o'clock position.  There is no bulging or perforation.  Left TM with mild bulging but no purulent fluid behind the tympanic membrane.  No erythema.    Eyes: sclera white    Nose:  no nasal discharge    Throat: moist mucus membranes Cardio: regular rate Pulm: normal work of breathing on room air  Assessment/ Plan: 63 y.o. female   1.  Otalgia of left ear Presumably related to eustachian tube disorder.  We discussed OMT manual manipulation of the eustachian tube.  I have also advised her to use oral antihistamine such as Claritin, Zyrtec or Xyzal.  Caution sedation with Zyrtec and Xyzal.  We discussed if she is not experiencing significant improvement or if she has acute worsening over the weekend to contact the after-hours number and I will plan for a steroid-containing eardrop.  At this time, there is no evidence of bacterial infection and I think that she has had some irritation of the ears related to her recent flights.  2. Otitic barotrauma, initial encounter Noted on the right but this is asymptomatic.  3. Eustachian tube disorder, left As above   No orders of the defined types were placed in this encounter.  No orders of the defined types were placed in this encounter.    Janora Norlander, DO [facility] [phone]

## 2018-10-28 NOTE — Patient Instructions (Signed)
If anything gets worse, call me and I will send in a steroid ear drop. No evidence of infection today.  Eustachian Tube Dysfunction  Eustachian tube dysfunction refers to a condition in which a blockage develops in the narrow passage that connects the middle ear to the back of the nose (eustachian tube). The eustachian tube regulates air pressure in the middle ear by letting air move between the ear and nose. It also helps to drain fluid from the middle ear space. Eustachian tube dysfunction can affect one or both ears. When the eustachian tube does not function properly, air pressure, fluid, or both can build up in the middle ear. What are the causes? This condition occurs when the eustachian tube becomes blocked or cannot open normally. Common causes of this condition include:  Ear infections.  Colds and other infections that affect the nose, mouth, and throat (upper respiratory tract).  Allergies.  Irritation from cigarette smoke.  Irritation from stomach acid coming up into the esophagus (gastroesophageal reflux). The esophagus is the tube that carries food from the mouth to the stomach.  Sudden changes in air pressure, such as from descending in an airplane or scuba diving.  Abnormal growths in the nose or throat, such as: ? Growths that line the nose (nasal polyps). ? Abnormal growth of cells (tumors). ? Enlarged tissue at the back of the throat (adenoids). What increases the risk? You are more likely to develop this condition if:  You smoke.  You are overweight.  You are a child who has: ? Certain birth defects of the mouth, such as cleft palate. ? Large tonsils or adenoids. What are the signs or symptoms? Common symptoms of this condition include:  A feeling of fullness in the ear.  Ear pain.  Clicking or popping noises in the ear.  Ringing in the ear.  Hearing loss.  Loss of balance.  Dizziness. Symptoms may get worse when the air pressure around you  changes, such as when you travel to an area of high elevation, fly on an airplane, or go scuba diving. How is this diagnosed? This condition may be diagnosed based on:  Your symptoms.  A physical exam of your ears, nose, and throat.  Tests, such as those that measure: ? The movement of your eardrum (tympanogram). ? Your hearing (audiometry). How is this treated? Treatment depends on the cause and severity of your condition.  In mild cases, you may relieve your symptoms by moving air into your ears. This is called "popping the ears."  In more severe cases, or if you have symptoms of fluid in your ears, treatment may include: ? Medicines to relieve congestion (decongestants). ? Medicines that treat allergies (antihistamines). ? Nasal sprays or ear drops that contain medicines that reduce swelling (steroids). ? A procedure to drain the fluid in your eardrum (myringotomy). In this procedure, a small tube is placed in the eardrum to:  Drain the fluid.  Restore the air in the middle ear space. ? A procedure to insert a balloon device through the nose to inflate the opening of the eustachian tube (balloon dilation). Follow these instructions at home: Lifestyle  Do not do any of the following until your health care provider approves: ? Travel to high altitudes. ? Fly in airplanes. ? Work in a Pension scheme manager or room. ? Scuba dive.  Do not use any products that contain nicotine or tobacco, such as cigarettes and e-cigarettes. If you need help quitting, ask your health care provider.  Keep your ears dry. Wear fitted earplugs during showering and bathing. Dry your ears completely after. General instructions  Take over-the-counter and prescription medicines only as told by your health care provider.  Use techniques to help pop your ears as recommended by your health care provider. These may include: ? Chewing gum. ? Yawning. ? Frequent, forceful swallowing. ? Closing your mouth,  holding your nose closed, and gently blowing as if you are trying to blow air out of your nose.  Keep all follow-up visits as told by your health care provider. This is important. Contact a health care provider if:  Your symptoms do not go away after treatment.  Your symptoms come back after treatment.  You are unable to pop your ears.  You have: ? A fever. ? Pain in your ear. ? Pain in your head or neck. ? Fluid draining from your ear.  Your hearing suddenly changes.  You become very dizzy.  You lose your balance. Summary  Eustachian tube dysfunction refers to a condition in which a blockage develops in the eustachian tube.  It can be caused by ear infections, allergies, inhaled irritants, or abnormal growths in the nose or throat.  Symptoms include ear pain, hearing loss, or ringing in the ears.  Mild cases are treated with maneuvers to unblock the ears, such as yawning or ear popping.  Severe cases are treated with medicines. Surgery may also be done (rare). This information is not intended to replace advice given to you by your health care provider. Make sure you discuss any questions you have with your health care provider. Document Released: 09/05/2015 Document Revised: 11/29/2017 Document Reviewed: 11/29/2017 Elsevier Interactive Patient Education  2019 [employer].

## 2019-06-13 ENCOUNTER — Other Ambulatory Visit: Payer: Self-pay

## 2019-06-14 ENCOUNTER — Ambulatory Visit: Payer: BLUE CROSS/BLUE SHIELD | Admitting: Nurse Practitioner

## 2019-06-14 ENCOUNTER — Encounter: Payer: Self-pay | Admitting: Nurse Practitioner

## 2019-06-14 VITALS — BP 138/88 | HR 77 | Temp 99.3°F | Resp 18 | Ht 59.0 in | Wt 147.0 lb

## 2019-06-14 DIAGNOSIS — E785 Hyperlipidemia, unspecified: Secondary | ICD-10-CM | POA: Diagnosis not present

## 2019-06-14 DIAGNOSIS — R911 Solitary pulmonary nodule: Secondary | ICD-10-CM

## 2019-06-14 DIAGNOSIS — K219 Gastro-esophageal reflux disease without esophagitis: Secondary | ICD-10-CM

## 2019-06-14 DIAGNOSIS — E559 Vitamin D deficiency, unspecified: Secondary | ICD-10-CM

## 2019-06-14 LAB — CMP14+EGFR
ALT: 109 IU/L — ABNORMAL HIGH (ref 0–32)
AST: 103 IU/L — ABNORMAL HIGH (ref 0–40)
Albumin/Globulin Ratio: 1.4 (ref 1.2–2.2)
Albumin: 4.2 g/dL (ref 3.8–4.8)
Alkaline Phosphatase: 94 IU/L (ref 39–117)
BUN/Creatinine Ratio: 18 (ref 12–28)
BUN: 16 mg/dL (ref 8–27)
Bilirubin Total: 1.1 mg/dL (ref 0.0–1.2)
CO2: 25 mmol/L (ref 20–29)
Calcium: 9.4 mg/dL (ref 8.7–10.3)
Chloride: 103 mmol/L (ref 96–106)
Creatinine, Ser: 0.88 mg/dL (ref 0.57–1.00)
GFR calc Af Amer: 81 mL/min/{1.73_m2} (ref >59)
GFR calc non Af Amer: 71 mL/min/{1.73_m2} (ref >59)
Globulin, Total: 3 g/dL (ref 1.5–4.5)
Glucose: 86 mg/dL (ref 65–99)
Potassium: 4.8 mmol/L (ref 3.5–5.2)
Sodium: 139 mmol/L (ref 134–144)
Total Protein: 7.2 g/dL (ref 6.0–8.5)

## 2019-06-14 LAB — LIPID PANEL
Chol/HDL Ratio: 2.8 ratio (ref 0.0–4.4)
Cholesterol, Total: 164 mg/dL (ref 100–199)
HDL: 59 mg/dL (ref >39)
LDL Chol Calc (NIH): 91 mg/dL (ref 0–99)
Triglycerides: 76 mg/dL (ref 0–149)
VLDL Cholesterol Cal: 14 mg/dL (ref 5–40)

## 2019-06-14 MED ORDER — ATORVASTATIN CALCIUM 20 MG PO TABS: 20.0000 mg | ORAL_TABLET | Freq: Every day | ORAL | 0 refills | Status: DC

## 2019-06-14 NOTE — Addendum Note (Signed)
Addended by: Rolena Infante on: 06/14/2019 10:02 AM   Modules accepted: Orders

## 2019-06-14 NOTE — Patient Instructions (Signed)

## 2019-06-14 NOTE — Progress Notes (Signed)
Subjective:    Patient ID: Amber Winters, female    DOB: [birth year], 63 y.o.   MRN: [id number]   Chief Complaint: Medical Management of Chronic Issues    HPI:  1. Hyperlipidemia with target LDL less than 100 Tries to watch diet and does some exercise. Is on liptor daily. BP Readings from Last 3 Encounters:  06/14/19 (!) 163/87  10/28/18 (!) 143/87  03/28/18 135/90     2. Gastroesophageal reflux disease, unspecified whether esophagitis present Has occasional symptoms but for the most part is doing well.  3. Vitamin D deficiency Takes a daily vitamin d supplement    Outpatient Encounter Medications as of 06/14/2019  Medication Sig  . atorvastatin (LIPITOR) 20 MG tablet Take 1 tablet (20 mg total) by mouth daily. (Needs to be seen before next refill)  . BIOTIN 5000 PO Take by mouth.  . COLLAGEN PO Take by mouth.  . fish oil-omega-3 fatty acids 1000 MG capsule Take 2 g by mouth daily.  . magnesium oxide (MAG-OX) 400 MG tablet Take 400 mg by mouth daily.  . nitrofurantoin (MACRODANTIN) 100 MG capsule Take by mouth daily.  . vitamin C (ASCORBIC ACID) 500 MG tablet Take 500 mg by mouth daily.  Marland Kitchen VITAMIN D PO Take by mouth.  . [DISCONTINUED] Cholecalciferol (VITAMIN D-3 PO) Take by mouth.  . phentermine (ADIPEX-P) 37.5 MG tablet Take 1 tablet (37.5 mg total) by mouth daily before breakfast. (Patient not taking: Reported on 06/14/2019)    Past Surgical History:  Procedure Laterality Date  . ABDOMINAL HYSTERECTOMY    . CATARACT EXTRACTION, BILATERAL    . CHOLECYSTECTOMY    . HERNIA REPAIR      Family History  Problem Relation Age of Onset  . Cancer Mother   . Anuerysm Father   . Hypertension Brother     New complaints: - had chest CT in 2019 and had 2 lung nodules that said did not need follow up if low risk. But patient is c/o chronic cough. She was on ranitidine for years and she was raised in a smokers home.  Social history: Helps take care of her grandkids. Is a  preschoolteacher  Controlled substance contract: n/a    Review of Systems  Constitutional: Negative for activity change and appetite change.  HENT: Negative.   Eyes: Negative for pain.  Respiratory: Negative for shortness of breath.   Cardiovascular: Negative for chest pain, palpitations and leg swelling.  Gastrointestinal: Negative for abdominal pain.  Endocrine: Negative for polydipsia.  Genitourinary: Negative.   Skin: Negative for rash.  Neurological: Negative for dizziness, weakness and headaches.  Hematological: Does not bruise/bleed easily.  Psychiatric/Behavioral: Negative.   All other systems reviewed and are negative.      Objective:   Physical Exam Vitals signs and nursing note reviewed.  Constitutional:      General: She is not in acute distress.    Appearance: Normal appearance. She is well-developed.  HENT:     Head: Normocephalic.     Nose: Nose normal.  Eyes:     Pupils: Pupils are equal, round, and reactive to light.  Neck:     Musculoskeletal: Normal range of motion and neck supple.     Vascular: No carotid bruit or JVD.  Cardiovascular:     Rate and Rhythm: Normal rate and regular rhythm.     Heart sounds: Normal heart sounds.  Pulmonary:     Effort: Pulmonary effort is normal. No respiratory distress.     Breath  sounds: Normal breath sounds. No wheezing or rales.  Chest:     Chest wall: No tenderness.  Abdominal:     General: Bowel sounds are normal. There is no distension or abdominal bruit.     Palpations: Abdomen is soft. There is no hepatomegaly, splenomegaly, mass or pulsatile mass.     Tenderness: There is no abdominal tenderness.  Musculoskeletal: Normal range of motion.  Lymphadenopathy:     Cervical: No cervical adenopathy.  Skin:    General: Skin is warm and dry.  Neurological:     Mental Status: She is alert and oriented to person, place, and time.     Deep Tendon Reflexes: Reflexes are normal and symmetric.  Psychiatric:         Behavior: Behavior normal.        Thought Content: Thought content normal.        Judgment: Judgment normal.     BP 138/88 (BP Location: Left Arm, Cuff Size: Normal)   Pulse 77   Temp 99.3 F (37.4 C) (Temporal)   Resp 18   Ht 4\' 11"  (1.499 m)   Wt 147 lb (66.7 kg)   SpO2 99%   BMI 29.69 kg/m          Assessment & Plan:  Raime Dehm comes in today with chief complaint of Medical Management of Chronic Issues   Diagnosis and orders addressed:  1. Hyperlipidemia with target LDL less than 100 Low fat diet - atorvastatin (LIPITOR) 20 MG tablet; Take 1 tablet (20 mg total) by mouth daily. (Needs to be seen before next refill)  Dispense: 90 tablet; Refill: 0  2. Gastroesophageal reflux disease, unspecified whether esophagitis present Avoid spicy foods Do not eat 2 hours prior to bedtime  3. Vitamin D deficiency Continue daily vitamin d supplement  4. Solitary pulmonary nodule - CT Chest Wo Contrast; Future   Labs pending Health Maintenance reviewed Diet and exercise encouraged  Follow up plan: 6 months   Schlusser, FNP

## 2019-06-20 LAB — HEPATITIS PANEL, ACUTE
Hep A IgM: NEGATIVE
Hep B C IgM: NEGATIVE
Hep C Virus Ab: <0.1 s/co ratio (ref 0.0–0.9)
Hepatitis B Surface Ag: NEGATIVE

## 2019-06-20 LAB — SPECIMEN STATUS REPORT

## 2019-07-09 ENCOUNTER — Ambulatory Visit (HOSPITAL_COMMUNITY): Payer: BC Managed Care – PPO

## 2019-07-11 ENCOUNTER — Other Ambulatory Visit: Payer: Self-pay

## 2019-07-11 ENCOUNTER — Ambulatory Visit (HOSPITAL_COMMUNITY)
Admission: RE | Admit: 2019-07-11 | Discharge: 2019-07-11 | Disposition: A | Discharge status: Home / Self Care | Payer: BC Managed Care – PPO | Source: Ambulatory Visit | Attending: Nurse Practitioner | Admitting: Nurse Practitioner

## 2019-07-11 ENCOUNTER — Other Ambulatory Visit: Payer: BC Managed Care – PPO

## 2019-07-11 DIAGNOSIS — R911 Solitary pulmonary nodule: Secondary | ICD-10-CM | POA: Insufficient documentation

## 2019-07-11 DIAGNOSIS — R748 Abnormal levels of other serum enzymes: Secondary | ICD-10-CM

## 2019-07-12 ENCOUNTER — Telehealth: Payer: Self-pay | Admitting: Nurse Practitioner

## 2019-07-12 LAB — CMP14+EGFR
ALT: 140 IU/L — ABNORMAL HIGH (ref 0–32)
AST: 137 IU/L — ABNORMAL HIGH (ref 0–40)
Albumin/Globulin Ratio: 1.3 (ref 1.2–2.2)
Albumin: 4.2 g/dL (ref 3.8–4.8)
Alkaline Phosphatase: 106 IU/L (ref 39–117)
BUN/Creatinine Ratio: 21 (ref 12–28)
BUN: 16 mg/dL (ref 8–27)
Bilirubin Total: 1.1 mg/dL (ref 0.0–1.2)
CO2: 22 mmol/L (ref 20–29)
Calcium: 9.6 mg/dL (ref 8.7–10.3)
Chloride: 102 mmol/L (ref 96–106)
Creatinine, Ser: 0.77 mg/dL (ref 0.57–1.00)
GFR calc Af Amer: 96 mL/min/{1.73_m2} (ref >59)
GFR calc non Af Amer: 83 mL/min/{1.73_m2} (ref >59)
Globulin, Total: 3.3 g/dL (ref 1.5–4.5)
Glucose: 73 mg/dL (ref 65–99)
Potassium: 4.2 mmol/L (ref 3.5–5.2)
Sodium: 137 mmol/L (ref 134–144)
Total Protein: 7.5 g/dL (ref 6.0–8.5)

## 2019-07-12 NOTE — Addendum Note (Signed)
Addended by: Chevis Pretty on: 07/12/2019 10:22 AM   Modules accepted: Orders

## 2019-07-12 NOTE — Telephone Encounter (Signed)
Refer to lab results.  

## 2019-07-13 ENCOUNTER — Ambulatory Visit (INDEPENDENT_AMBULATORY_CARE_PROVIDER_SITE_OTHER): Payer: BC Managed Care – PPO | Admitting: Family Medicine

## 2019-07-13 ENCOUNTER — Encounter: Payer: Self-pay | Admitting: Family Medicine

## 2019-07-13 DIAGNOSIS — H9201 Otalgia, right ear: Secondary | ICD-10-CM | POA: Diagnosis not present

## 2019-07-13 DIAGNOSIS — J3489 Other specified disorders of nose and nasal sinuses: Secondary | ICD-10-CM

## 2019-07-13 MED ORDER — AMOXICILLIN-POT CLAVULANATE 875-125 MG PO TABS: 1.0000 | ORAL_TABLET | Freq: Two times a day (BID) | ORAL | 0 refills | Status: AC

## 2019-07-13 NOTE — Progress Notes (Signed)
Virtual Visit via telephone Note Due to COVID-19 pandemic this visit was conducted virtually. This visit type was conducted due to national recommendations for restrictions regarding the COVID-19 Pandemic (e.g. social distancing, sheltering in place) in an effort to limit this patient's exposure and mitigate transmission in our community. All issues noted in this document were discussed and addressed.  A physical exam was not performed with this format.   I connected with Amber Winters on 07/13/2019 at 1115 by telephone and verified that I am speaking with the correct person using two identifiers. Amber Winters is currently located at home and family is currently with them during visit. The provider, Monia Pouch, FNP is located in their office at time of visit.  I discussed the limitations, risks, security and privacy concerns of performing an evaluation and management service by telephone and the availability of in person appointments. I also discussed with the patient that there may be a patient responsible charge related to this service. The patient expressed understanding and agreed to proceed.  Subjective:  Patient ID: Amber Winters, female    DOB: [birth year], 63 y.o.   MRN: [id number]  Chief Complaint:  Otalgia   HPI: Amber Winters is a 63 y.o. female presenting on 07/13/2019 for Otalgia   Pt reports severe right otalgia along with sinus pressure. States the pain started yesterday and has become increasingly worse. States she has not taken anything for the symptoms because her liver enzymes are elevated. She denies drainage from her ear. States she does have a headache and sinus drainage.   Otalgia  There is pain in the right ear. This is a new problem. The current episode started yesterday. The problem occurs constantly. The problem has been rapidly worsening. The pain is at a severity of 6/10. The pain is moderate. Associated symptoms include headaches, rhinorrhea and a sore throat. Pertinent  negatives include no abdominal pain, coughing, diarrhea, ear discharge, hearing loss, neck pain, rash or vomiting. She has tried nothing for the symptoms.     Relevant past medical, surgical, family, and social history reviewed and updated as indicated.  Allergies and medications reviewed and updated.   Past Medical History:  Diagnosis Date  . Hyperlipidemia     Past Surgical History:  Procedure Laterality Date  . ABDOMINAL HYSTERECTOMY    . CATARACT EXTRACTION, BILATERAL    . CHOLECYSTECTOMY    . HERNIA REPAIR      Social History   Socioeconomic History  . Marital status: Married    Spouse name: Not on file  . Number of children: Not on file  . Years of education: Not on file  . Highest education level: Not on file  Occupational History  . Not on file  Social Needs  . Financial resource strain: Not on file  . Food insecurity    Worry: Not on file    Inability: Not on file  . Transportation needs    Medical: Not on file    Non-medical: Not on file  Tobacco Use  . Smoking status: Never Smoker  . Smokeless tobacco: Never Used  Substance and Sexual Activity  . Alcohol use: No  . Drug use: No  . Sexual activity: Not on file  Lifestyle  . Physical activity    Days per week: Not on file    Minutes per session: Not on file  . Stress: Not on file  Relationships  . Social connections    Talks on phone: Not on file  Gets together: Not on file    Attends religious service: Not on file    Active member of club or organization: Not on file    Attends meetings of clubs or organizations: Not on file    Relationship status: Not on file  . Intimate partner violence    Fear of current or ex partner: Not on file    Emotionally abused: Not on file    Physically abused: Not on file    Forced sexual activity: Not on file  Other Topics Concern  . Not on file  Social History Narrative  . Not on file    Outpatient Encounter Medications as of 07/13/2019  Medication Sig   . amoxicillin-clavulanate (AUGMENTIN) 875-125 MG tablet Take 1 tablet by mouth 2 (two) times daily for 10 days.  Marland Kitchen atorvastatin (LIPITOR) 20 MG tablet Take 1 tablet (20 mg total) by mouth daily. (Needs to be seen before next refill)  . BIOTIN 5000 PO Take by mouth.  . cetirizine (ZYRTEC) 10 MG tablet Take 10 mg by mouth daily.  . COLLAGEN PO Take by mouth.  . fish oil-omega-3 fatty acids 1000 MG capsule Take 2 g by mouth daily.  . magnesium oxide (MAG-OX) 400 MG tablet Take 400 mg by mouth daily.  . phentermine (ADIPEX-P) 37.5 MG tablet Take 1 tablet (37.5 mg total) by mouth daily before breakfast. (Patient not taking: Reported on 06/14/2019)  . vitamin C (ASCORBIC ACID) 500 MG tablet Take 500 mg by mouth daily.  Marland Kitchen VITAMIN D PO Take by mouth.   No facility-administered encounter medications on file as of 07/13/2019.     Allergies  Allergen Reactions  . Ciprofloxacin Other (See Comments)    Patient states this causes severe muscle aching and discomfort.  [name] [Aspirin]     Tinnitis  . Sulfa Antibiotics   . Zithromax [Azithromycin]     Abdominal cramps    Review of Systems  Constitutional: Negative for activity change, appetite change, chills, diaphoresis, fatigue, fever and unexpected weight change.  HENT: Positive for congestion, ear pain, postnasal drip, rhinorrhea, sinus pressure, sinus pain and sore throat. Negative for dental problem, drooling, ear discharge, facial swelling, hearing loss, mouth sores, nosebleeds, sneezing, tinnitus, trouble swallowing and voice change.   Eyes: Negative.   Respiratory: Negative for cough, chest tightness and shortness of breath.   Cardiovascular: Negative for chest pain, palpitations and leg swelling.  Gastrointestinal: Negative for abdominal pain, blood in stool, constipation, diarrhea, nausea and vomiting.  Endocrine: Negative.   Genitourinary: Negative for decreased urine volume, difficulty urinating, dysuria, frequency and urgency.   Musculoskeletal: Negative for arthralgias, myalgias and neck pain.  Skin: Negative.  Negative for color change, pallor and rash.  Allergic/Immunologic: Negative.   Neurological: Positive for headaches. Negative for dizziness, tremors, seizures, syncope, facial asymmetry, speech difficulty, weakness, light-headedness and numbness.  Hematological: Negative.   Psychiatric/Behavioral: Negative for confusion, hallucinations, sleep disturbance and suicidal ideas.  All other systems reviewed and are negative.        Observations/Objective: No vital signs or physical exam, this was a telephone or virtual health encounter.  Pt alert and oriented, answers all questions appropriately, and able to speak in full sentences.    Assessment and Plan: Tiawana was seen today for otalgia.  Diagnoses and all orders for this visit:  Acute otalgia, right Sinus pressure Pt with significant right otalgia along with sinus pressure and drainage. Will place on below. Pt aware she can take ibuprofen for the pain. Report  any new, worsening, or persistent symptoms. Follow up as needed.  -     amoxicillin-clavulanate (AUGMENTIN) 875-125 MG tablet; Take 1 tablet by mouth 2 (two) times daily for 10 days.     Follow Up Instructions: Return if symptoms worsen or fail to improve.    I discussed the assessment and treatment plan with the patient. The patient was provided an opportunity to ask questions and all were answered. The patient agreed with the plan and demonstrated an understanding of the instructions.   The patient was advised to call back or seek an in-person evaluation if the symptoms worsen or if the condition fails to improve as anticipated.  The above assessment and management plan was discussed with the patient. The patient verbalized understanding of and has agreed to the management plan. Patient is aware to call the clinic if they develop any new symptoms or if symptoms persist or worsen. Patient is  aware when to return to the clinic for a follow-up visit. Patient educated on when it is appropriate to go to the emergency department.    I provided 15 minutes of non-face-to-face time during this encounter. The call started at 1115. The call ended at 1130. The other time was used for coordination of care.    Monia Pouch, FNP-C [facility] [address] [place], [place] [address] [phone] 07/13/2019

## 2019-08-01 ENCOUNTER — Other Ambulatory Visit (HOSPITAL_COMMUNITY): Payer: BC Managed Care – PPO

## 2019-10-12 ENCOUNTER — Ambulatory Visit (HOSPITAL_COMMUNITY)
Admission: RE | Admit: 2019-10-12 | Discharge: 2019-10-12 | Disposition: A | Discharge status: Home / Self Care | Payer: BC Managed Care – PPO | Source: Ambulatory Visit | Attending: Nurse Practitioner | Admitting: Nurse Practitioner

## 2019-10-12 ENCOUNTER — Other Ambulatory Visit: Payer: Self-pay

## 2019-10-12 DIAGNOSIS — R911 Solitary pulmonary nodule: Secondary | ICD-10-CM | POA: Diagnosis not present

## 2019-10-12 LAB — POCT I-STAT CREATININE: Creatinine, Ser: 0.7 mg/dL (ref 0.44–1.00)

## 2019-10-12 MED ORDER — IOHEXOL 300 MG/ML  SOLN
75.0000 mL | Freq: Once | INTRAMUSCULAR | Status: AC | PRN
  Administered 2019-10-12: 75 mL via INTRAVENOUS

## 2019-10-15 ENCOUNTER — Telehealth: Payer: Self-pay | Admitting: Nurse Practitioner

## 2019-10-15 NOTE — Telephone Encounter (Signed)
Please advise on CT chest from 10/12/19

## 2019-10-16 ENCOUNTER — Telehealth: Payer: Self-pay | Admitting: Nurse Practitioner

## 2019-10-16 DIAGNOSIS — R911 Solitary pulmonary nodule: Secondary | ICD-10-CM

## 2019-10-16 NOTE — Telephone Encounter (Signed)
Pt called to let MMM know that she does have doctor preferences to be sent to.  Preferences for Cardiovascular Surgeons:   Dr Cyndia Bent Dr Roxy Manns Dr Servando Snare Dr Prescott Gum

## 2019-10-17 NOTE — Telephone Encounter (Signed)
Patient aware and verbalized understanding. °

## 2019-10-17 NOTE — Telephone Encounter (Signed)
Let patient know -changed mind- sending her to pulmonologist

## 2019-11-07 ENCOUNTER — Other Ambulatory Visit: Payer: Self-pay

## 2019-11-07 ENCOUNTER — Telehealth: Payer: Self-pay | Admitting: Internal Medicine

## 2019-11-07 ENCOUNTER — Encounter: Payer: Self-pay | Admitting: Internal Medicine

## 2019-11-07 ENCOUNTER — Ambulatory Visit: Payer: BC Managed Care – PPO | Admitting: Internal Medicine

## 2019-11-07 VITALS — BP 168/108 | HR 116 | Temp 97.1°F | Ht 59.5 in | Wt 146.6 lb

## 2019-11-07 DIAGNOSIS — R911 Solitary pulmonary nodule: Secondary | ICD-10-CM

## 2019-11-07 DIAGNOSIS — J849 Interstitial pulmonary disease, unspecified: Secondary | ICD-10-CM

## 2019-11-07 DIAGNOSIS — T378X5A Adverse effect of other specified systemic anti-infectives and antiparasitics, initial encounter: Secondary | ICD-10-CM

## 2019-11-07 DIAGNOSIS — Z77120 Contact with and (suspected) exposure to mold (toxic): Secondary | ICD-10-CM | POA: Diagnosis not present

## 2019-11-07 DIAGNOSIS — K219 Gastro-esophageal reflux disease without esophagitis: Secondary | ICD-10-CM | POA: Diagnosis not present

## 2019-11-07 DIAGNOSIS — K449 Diaphragmatic hernia without obstruction or gangrene: Secondary | ICD-10-CM

## 2019-11-07 LAB — SEDIMENTATION RATE: Sed Rate: 97 mm/hr — ABNORMAL HIGH (ref 0–30)

## 2019-11-07 NOTE — Patient Instructions (Addendum)
ICD-10-CM   1. Nodule of middle lobe of right lung  R91.1   2. ILD (interstitial lung disease) ([place])  J84.9   3. Nitrofurantoin adverse reaction, initial encounter  T37.8X5A   4. Hiatal hernia with GERD  K21.9    K44.9   5. 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      Nodule of middle lobe of right lung - 70m feb 2020  - plan  - continue to monitor - will decide if next ct is 3 or 6 months - decide at followup  ILD (interstitial lung disease) ([facility]) Nitrofurantoin adverse reaction, initial encounter Hiatal hernia with GERD Mold exposure  - do ILD questionnaire 11/07/2019 - stop nitrafurantoin  - Blood work - Serum: ESR, ACE, ANA, DS-DNA, RF, anti-CCP,  ANCA screen, MPO, PR-3, Total CK,  Aldolase,   scl-70, ssA, ssB, anti-JO-1 Hypersensitivity Pneumonitis Panel  Followup 15 min telephone visit on 11/09/19 when I am in [facility]  - discuss ILD questeionnaire, some of the blood work will be back and discuss next steps that could include bronchoscopy versus empiric steroid treatment

## 2019-11-07 NOTE — Telephone Encounter (Signed)
Patient stated she was seen by Dr. Chase Caller, and he suggested discontinuing generic for macrobid.Urology  suggested Keflex to help with urinary tract infections.Patient wants  to know if ok with Dr. Chase Caller to take Keflex or if she should wait until cough is better.  Message routed to Dr. Chase Caller
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
## 2019-11-09 ENCOUNTER — Ambulatory Visit: Payer: BC Managed Care – PPO | Admitting: Pulmonary Disease

## 2019-11-09 ENCOUNTER — Other Ambulatory Visit (HOSPITAL_COMMUNITY)
Admission: RE | Admit: 2019-11-09 | Discharge: 2019-11-09 | Disposition: A | Discharge status: Home / Self Care | Payer: BC Managed Care – PPO | Source: Ambulatory Visit | Attending: Internal Medicine | Admitting: Internal Medicine

## 2019-11-09 ENCOUNTER — Telehealth: Payer: Self-pay | Admitting: Internal Medicine

## 2019-11-09 ENCOUNTER — Other Ambulatory Visit: Payer: Self-pay

## 2019-11-09 ENCOUNTER — Ambulatory Visit (INDEPENDENT_AMBULATORY_CARE_PROVIDER_SITE_OTHER): Payer: BC Managed Care – PPO | Admitting: Internal Medicine

## 2019-11-09 DIAGNOSIS — Z20822 Contact with and (suspected) exposure to covid-19: Secondary | ICD-10-CM | POA: Diagnosis present

## 2019-11-09 DIAGNOSIS — R700 Elevated erythrocyte sedimentation rate: Secondary | ICD-10-CM | POA: Diagnosis not present

## 2019-11-09 DIAGNOSIS — K449 Diaphragmatic hernia without obstruction or gangrene: Secondary | ICD-10-CM

## 2019-11-09 DIAGNOSIS — K219 Gastro-esophageal reflux disease without esophagitis: Secondary | ICD-10-CM | POA: Diagnosis not present

## 2019-11-09 DIAGNOSIS — R768 Other specified abnormal immunological findings in serum: Secondary | ICD-10-CM

## 2019-11-09 DIAGNOSIS — J849 Interstitial pulmonary disease, unspecified: Secondary | ICD-10-CM | POA: Diagnosis not present

## 2019-11-09 DIAGNOSIS — Z77120 Contact with and (suspected) exposure to mold (toxic): Secondary | ICD-10-CM

## 2019-11-09 DIAGNOSIS — T378X5A Adverse effect of other specified systemic anti-infectives and antiparasitics, initial encounter: Secondary | ICD-10-CM

## 2019-11-09 LAB — CYCLIC CITRUL PEPTIDE ANTIBODY, IGG: Cyclic Citrullin Peptide Ab: <16 UNITS

## 2019-11-09 LAB — ANCA SCREEN W REFLEX TITER
ANCA Screen: POSITIVE — AB
P-ANCA Titer: 1:80 {titer} — ABNORMAL HIGH

## 2019-11-09 LAB — MPO/PR-3 (ANCA) ANTIBODIES
Myeloperoxidase Abs: <1 AI
Serine Protease 3: 1.2 AI — ABNORMAL HIGH

## 2019-11-09 LAB — ALDOLASE: Aldolase: 14.8 U/L — ABNORMAL HIGH (ref <=8.1)

## 2019-11-09 LAB — JO-1 ANTIBODY-IGG: Jo-1 Autoabs: <1 AI

## 2019-11-09 LAB — CK TOTAL AND CKMB (NOT AT ARMC)
CK, MB: <0.7 ng/mL (ref 0–5.0)
Total CK: 47 U/L (ref 29–143)

## 2019-11-09 LAB — RHEUMATOID FACTOR: Rheumatoid fact SerPl-aCnc: <14 IU/mL (ref <14)

## 2019-11-09 LAB — ANGIOTENSIN CONVERTING ENZYME: Angiotensin-Converting Enzyme: 72 U/L — ABNORMAL HIGH (ref 9–67)

## 2019-11-09 NOTE — Telephone Encounter (Signed)
Please schedule the following: There is an urgent bronchoscopy result request because of patient's illness we need to make a quick decision on starting steroids.  Diagnosis: Interstitial lung disease.  Concern for hypersensitive pneumonitis versus nitrofurantoin toxicity   Procedure: Bronchoscopy with lavage.  No transbronchial biopsy.  No fluoroscopy.  Please arrange at [facility]   Date: Anytime during the week of November 11, 2019 with the exception being November 13, 2019 when I have 2 other bronchoscopies on that day.   Location: [facility]  Medication Restriction: Moderate sedation only  Anticoagulate/Antiplatelet: If patient is on any aspirin or Plavix or anticoagulation she needs to stop it 72 hours before the procedure   Please coordinate Pre-op COVID Testing     Let me know ASAP    SIGNATURE    Dr. Brand Males, M.D., F.C.C.P,  Pulmonary and Critical Care Medicine Staff Physician, [place] Director - Interstitial Lung Disease  Program  Pulmonary [place] at [place], [place], [address]  Pager: [phone], If no answer or between  15:00h - 7:00h: call [phone] Telephone: [phone]  9:33 AM 11/09/2019

## 2019-11-09 NOTE — Patient Instructions (Addendum)
ICD-10-CM   1. ILD (interstitial lung disease) ([place])  J84.9   2. Nitrofurantoin adverse reaction, initial encounter  T37.8X5A   3. Hiatal hernia with GERD  K21.9    K44.9   4. 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   5. Elevated sed rate  R70.0   6. PR3 ANCA antibodies present  R76.8       Nodule of middle lobe of right lung - 11m feb 2020  - plan  - continue to monitor - will decide if next ct is 3 or 6 months - decide at followup  ILD (interstitial lung disease) (HCC) Nitrofurantoin adverse reaction, initial encounter Hiatal hernia with GERD Mold exposure Trace positive PR-3 and p-ANCA  -Glad you are feeling better once you came off nitrofurantoin  Plan  -Okay to take daily cephalexin from a pulmonary standpoint -Schedule bronchoscopy with lavage week of November 11 2019 (no bronchoscopy on 13 November 2019)  -Small scope, no TB risk only bronchoalveolar lavage no biopsy no fluoroscopy, moderate sedation  -[facility] t o look for microscopic hematuria and if this was sterile in the context of trace positive PR-3 and p-ANCA antibodies

## 2019-11-09 NOTE — Telephone Encounter (Signed)
Called spoke with patient She is scheduled for Bronch with Dr. Chase Caller on 11/12/19 at 1:30. Patient was instructed to arrive at 12. She was told to bring someone with her as she will not be able to drive herself home. She was instructed no eating or drinking after midnight.   Patient scheduled for covid screen at [facility] today 11/09/19 at 4:45.   Nothing further needed, will route to MR as [facility]

## 2019-11-09 NOTE — Telephone Encounter (Signed)
Answered today
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
## 2019-11-10 LAB — SARS CORONAVIRUS 2 (TAT 6-24 HRS): SARS Coronavirus 2: NEGATIVE

## 2019-11-12 ENCOUNTER — Encounter (HOSPITAL_COMMUNITY): Payer: Self-pay | Admitting: Internal Medicine

## 2019-11-12 ENCOUNTER — Encounter (HOSPITAL_COMMUNITY)
Admission: RE | Disposition: A | Discharge status: Home / Self Care | Payer: Self-pay | Source: Home / Self Care | Attending: Internal Medicine

## 2019-11-12 ENCOUNTER — Ambulatory Visit (HOSPITAL_COMMUNITY)
Admission: RE | Admit: 2019-11-12 | Discharge: 2019-11-12 | Disposition: A | Discharge status: Home / Self Care | Payer: BC Managed Care – PPO | Attending: Internal Medicine | Admitting: Internal Medicine

## 2019-11-12 ENCOUNTER — Telehealth: Payer: Self-pay | Admitting: Internal Medicine

## 2019-11-12 ENCOUNTER — Other Ambulatory Visit: Payer: Self-pay

## 2019-11-12 DIAGNOSIS — Z79899 Other long term (current) drug therapy: Secondary | ICD-10-CM | POA: Diagnosis not present

## 2019-11-12 DIAGNOSIS — Z77120 Contact with and (suspected) exposure to mold (toxic): Secondary | ICD-10-CM | POA: Diagnosis not present

## 2019-11-12 DIAGNOSIS — Z87440 Personal history of urinary (tract) infections: Secondary | ICD-10-CM | POA: Diagnosis not present

## 2019-11-12 DIAGNOSIS — J849 Interstitial pulmonary disease, unspecified: Secondary | ICD-10-CM

## 2019-11-12 DIAGNOSIS — K449 Diaphragmatic hernia without obstruction or gangrene: Secondary | ICD-10-CM | POA: Insufficient documentation

## 2019-11-12 DIAGNOSIS — K219 Gastro-esophageal reflux disease without esophagitis: Secondary | ICD-10-CM | POA: Diagnosis not present

## 2019-11-12 HISTORY — PX: BRONCHIAL WASHINGS: SHX5105

## 2019-11-12 HISTORY — PX: VIDEO BRONCHOSCOPY: SHX5072

## 2019-11-12 LAB — ANA+ENA+DNA/DS+SCL 70+SJOSSA/B
ANA Titer 1: NEGATIVE
ENA RNP Ab: >8 AI — ABNORMAL HIGH (ref 0.0–0.9)
ENA SM Ab Ser-aCnc: 0.4 AI (ref 0.0–0.9)
ENA SSA (RO) Ab: 1.1 AI — ABNORMAL HIGH (ref 0.0–0.9)
ENA SSB (LA) Ab: <0.2 AI (ref 0.0–0.9)
Scleroderma (Scl-70) (ENA) Antibody, IgG: 0.3 AI (ref 0.0–0.9)
dsDNA Ab: 1 IU/mL (ref 0–9)

## 2019-11-12 LAB — BODY FLUID CELL COUNT WITH DIFFERENTIAL
Eos, Fluid: 8 %
Lymphs, Fluid: 58 %
Monocyte-Macrophage-Serous Fluid: 17 % — ABNORMAL LOW (ref 50–90)
Neutrophil Count, Fluid: 17 % (ref 0–25)
Total Nucleated Cell Count, Fluid: 433 cu mm (ref 0–1000)

## 2019-11-12 LAB — HYPERSENSITIVITY PNEUMONITIS
A. Pullulans Abs: NEGATIVE
A.Fumigatus #1 Abs: NEGATIVE
Micropolyspora faeni, IgG: NEGATIVE
Pigeon Serum Abs: NEGATIVE
Thermoact. Saccharii: NEGATIVE
Thermoactinomyces vulgaris, IgG: NEGATIVE

## 2019-11-12 SURGERY — BRONCHOSCOPY, WITH FLUOROSCOPY

## 2019-11-12 MED ORDER — FENTANYL CITRATE (PF) 100 MCG/2ML IJ SOLN
INTRAMUSCULAR | Status: DC | PRN
  Administered 2019-11-12: 50 ug via INTRAVENOUS
  Administered 2019-11-12: 25 ug via INTRAVENOUS

## 2019-11-12 MED ORDER — PHENYLEPHRINE HCL 0.25 % NA SOLN: 1.0000 | Freq: Four times a day (QID) | NASAL | Status: DC | PRN

## 2019-11-12 MED ORDER — LIDOCAINE HCL 1 % IJ SOLN
INTRAMUSCULAR | Status: AC
  Filled 2019-11-12: qty 1

## 2019-11-12 MED ORDER — MIDAZOLAM HCL (PF) 10 MG/2ML IJ SOLN
INTRAMUSCULAR | Status: DC | PRN
  Administered 2019-11-12: 1 mg via INTRAVENOUS
  Administered 2019-11-12: 2 mg via INTRAVENOUS

## 2019-11-12 MED ORDER — LIDOCAINE HCL URETHRAL/MUCOSAL 2 % EX GEL
1.0000 "application " | Freq: Once | CUTANEOUS | Status: AC
  Administered 2019-11-12: 1 via TOPICAL

## 2019-11-12 MED ORDER — PHENYLEPHRINE HCL 1 % NA SOLN
NASAL | Status: AC
  Filled 2019-11-12: qty 15

## 2019-11-12 MED ORDER — LIDOCAINE HCL URETHRAL/MUCOSAL 2 % EX GEL
CUTANEOUS | Status: AC
  Filled 2019-11-12: qty 30

## 2019-11-12 MED ORDER — LIDOCAINE HCL 1 % IJ SOLN
INTRAMUSCULAR | Status: DC | PRN
  Administered 2019-11-12: 8 mL

## 2019-11-12 MED ORDER — FENTANYL CITRATE (PF) 100 MCG/2ML IJ SOLN
INTRAMUSCULAR | Status: AC
  Filled 2019-11-12: qty 4

## 2019-11-12 MED ORDER — SODIUM CHLORIDE 0.9 % IV SOLN
INTRAVENOUS | Status: DC
  Administered 2019-11-12: 500 mL via INTRAVENOUS

## 2019-11-12 MED ORDER — MIDAZOLAM HCL (PF) 5 MG/ML IJ SOLN
INTRAMUSCULAR | Status: AC
  Filled 2019-11-12: qty 2

## 2019-11-12 MED ORDER — BUTAMBEN-TETRACAINE-BENZOCAINE 2-2-14 % EX AERO: 1.0000 | INHALATION_SPRAY | Freq: Once | CUTANEOUS | Status: AC

## 2019-11-12 MED ORDER — BUTAMBEN-TETRACAINE-BENZOCAINE 2-2-14 % EX AERO
INHALATION_SPRAY | CUTANEOUS | Status: DC | PRN
  Administered 2019-11-12: 2 via TOPICAL

## 2019-11-12 NOTE — Interval H&P Note (Signed)
Patient Amber Winters presented for bronchoscopy with BAL for eval of ILD. She has trace autoimmune antibodies positive and nitrafuratonin exposure. Cough improved 11/12/2019 after dc nitrofurantoin. ILD changes are c/w HP. There is also hx of mold exposure.   Exam is non focal and unchanged  Plan  - bronch with BAL. Holding off TTBx - will plan for Envisia or SLB (most likely latter) if course is unimproved.        - intention is to rule out infection and commit to steroids. But also eval for lymphocytisis  Risks of pneumothorax, hemothorax, sedation/anesthesia complications such as cardiac or respiratory arrest or hypotension, stroke and bleeding all explained. Benefits of diagnosis but limitations of non-diagnosis also explained. Patient verbalized understanding and wished to proceed.    .nmrsig

## 2019-11-12 NOTE — Op Note (Addendum)
Name:  [name] MRN:  [id number] DOB:  1956/01/06  PROCEDURE NOTE  Procedure(s): Flexible bronchoscopy [phone]) Bronchial alveolar lavage [phone]) of the  - right middle lobe   Indications:  Cough, dyspnea, mold exposure, nitrafurantoin intake, autoimmune antibodies -.> ILD  Consent:  Procedure, benefits, risks and alternatives discussed.  Questions answered.  Consent obtained.  Anesthesia:  Moderate Sedation  Location: [facility] long endoscopy suite  Procedure summary:  Appropriate equipment was assembled.  The patient was brought to the procedure suite room and identified as Amber Winters with October 03, 1955  Safety timeout was performed. The patient was placed supine on the  table, airway and moderate sedation administered by this operator  After the appropriate level of moderation was assured, flexible video bronchoscope was lubricated and inserted through the endotracheal tube.  1% Lidocaine were administered through the bronchoscope to augment moderate sedation  Airway examination was not performed bilaterally to subsegmental level.  Done only at trachea, carina and and RMLMinimal clear secretions were noted, mucosa appeared normal and  NO endobronchial lesions were identified.  Bronchial alveolar lavage of the Right Middle Lobe was performed with 20 mL x 1 suctioned into cannister. Then, 40cc x 2 followed by 20cc x 1 of normal saline. -> returns were pinkish. 2 different cannisters- > returns were more pinkish in the 2nd 20cc. Total return of 40 mL of fluid, after which the bronchoscope was withdrawn.   After hemostasis was assure, the bronchoscope was withdrawn.  The patient was recovered and then  transferred to recovery area  Post-procedure chest x-ray was NOT ordered. Not required  Specimens sent: Bronchial alveolar lavage specimen of the  RML for cell count (including CD4/CD8 assessment), microbiology and cytology.  Complications:  No immediate complications were noted.   Hemodynamic parameters and oxygenation remained stable throughout the procedure.  Estimated blood loss:  NONE  IMPRESSION 1. Limited airway exam - normal airway exam 2. BAL RML - pinkish returns that persisted in color  3. No biopsies 4/ Autoimmune antibodies +ve > will refer rheum 5. Hiatal hernia on hx -> stop fish poil   Followup 1. Refer rheum 2.  Followup with telephone/video visit with an APP in 1 week - if culture negative do 2 month steroid (ensure pred safety before startnig steroids) - 11/19/19 - 9.30am with [name]  Future Appointments  Date Time Provider [place]  11/19/2019  9:30 AM Lauraine Rinne, NP [facility] None  12/13/2019 10:00 AM Chevis Pretty, FNP [facility] None     Dr. Brand Males, M.D., [facility]C.P Pulmonary and Critical Care Medicine Staff Physician [facility] Pager: [phone], If no answer or between  15:00h - 7:00h: call [phone]  11/12/2019 2:18 PM

## 2019-11-12 NOTE — Discharge Instructions (Signed)
Flexible Bronchoscopy, Care After This sheet gives you information about how to care for yourself after your test. Your doctor may also give you more specific instructions. If you have problems or questions, contact your doctor. Follow these instructions at home: Eating and drinking  Do not eat or drink anything (not even water) for 2 hours after your test, or until your numbing medicine (local anesthetic) wears off.  When your numbness is gone and your cough and gag reflexes have come back, you may: ? Eat only soft foods. ? Slowly drink liquids.  The day after the test, go back to your normal diet. Driving  Do not drive for 24 hours if you were given a medicine to help you relax (sedative).  Do not drive or use heavy machinery while taking prescription pain medicine. General instructions   Take over-the-counter and prescription medicines only as told by your doctor.  Return to your normal activities as told. Ask what activities are safe for you.  Do not use any products that have nicotine or tobacco in them. This includes cigarettes and e-cigarettes. If you need help quitting, ask your doctor.  Keep all follow-up visits as told by your doctor. This is important. It is very important if you had a tissue sample (biopsy) taken. Get help right away if:  You have shortness of breath that gets worse.  You get light-headed.  You feel like you are going to pass out (faint).  You have chest pain.  You cough up: ? More than a little blood. ? More blood than before. Summary  Do not eat or drink anything (not even water) for 2 hours after your test, or until your numbing medicine wears off.  Do not use cigarettes. Do not use e-cigarettes.  Get help right away if you have chest pain. This information is not intended to replace advice given to you by your health care provider. Make sure you discuss any questions you have with your health care provider. Document Revised: 07/22/2017  Document Reviewed: 08/27/2016 Elsevier Patient Education  2020 [employer].  Future Appointments  Date Time Provider [place]  11/19/2019  9:30 AM Lauraine Rinne, NP [facility] None  12/13/2019 10:00 AM Chevis Pretty, FNP [facility] None

## 2019-11-12 NOTE — Telephone Encounter (Signed)
Post procedure -> updated patient husband while patient is in recovery curentl  1. ILD ->  - ddx - nitrofurantoin  (OR) positive serology (ACE, ENA, SSA,  PR-3 (OR) Hiatal hernia - on fish oil; (OR) some combination of aboove (OR) Mold exposure pre-pandemic at church   2. Noted phentermine intake  Plan - emily for you in red - stop fish oil - elevate head end of bed - refer rheumatology - - fu [name] next week to discuss reuslts - husband to figure out phentermine intake    SIGNATURE    Dr. Brand Males, M.D., F.C.C.P,  Pulmonary and Critical Care Medicine Staff Physician, [place] Director - Interstitial Lung Disease  Program  [facility] [place] at [place], [place], [address]  Pager: [phone], If no answer or between  15:00h - 7:00h: call [phone] Telephone: [phone]  2:50 PM 11/12/2019   Results for Amber Winters, Amber Winters (MRN [id number]) as of 11/12/2019 14:44  Ref. Range 11/07/2019 10:37  Sed Rate Latest Ref Range: 0 - 30 mm/hr 97 Repeated and verified X2. (H)    Results for Amber Winters, Amber Winters (MRN [id number]) as of 11/12/2019 14:44  Ref. Range 11/07/2019 10:37  ANA Titer 1 Unknown Negative  Angiotensin-Converting Enzyme Latest Ref Range: 9 - 67 U/L 72 (H)  Cyclic Citrullin Peptide Ab Latest Units: UNITS <16  dsDNA Ab Latest Ref Range: 0 - 9 IU/mL 1  ENA RNP Ab Latest Ref Range: 0.0 - 0.9 AI >8.0 (H)  ENA SSA (RO) Ab Latest Ref Range: 0.0 - 0.9 AI 1.1 (H)  ENA SSB (LA) Ab Latest Ref Range: 0.0 - 0.9 AI <0.2  Myeloperoxidase Abs Latest Units: AI <1.0  Serine Protease 3 Latest Units: AI 1.2 (H)  RA Latex Turbid. Latest Ref Range: <14 IU/mL <14  ENA SM Ab Ser-aCnc Latest Ref Range: 0.0 - 0.9 AI 0.4  Scleroderma (Scl-70) (ENA) Antibody, IgG Latest Ref Range: 0.0 - 0.9 AI 0.3    Results for Amber Winters, Amber Winters (MRN [id number]) as of 11/12/2019 14:44  Ref. Range 11/07/2019 10:37  A.Fumigatus #1 Abs Latest Ref  Range: Negative  Negative  A. Pullulans Abs Latest Ref Range: Negative  Negative  Micropolyspora faeni, IgG Latest Ref Range: Negative  Negative  Pigeon Serum Abs Latest Ref Range: Negative  Negative  Thermoact. Saccharii Latest Ref Range: Negative  Negative  Thermoactinomyces vulgaris, IgG Latest Ref Range: Negative  Negative

## 2019-11-13 ENCOUNTER — Telehealth: Payer: Self-pay | Admitting: Internal Medicine

## 2019-11-13 ENCOUNTER — Encounter: Payer: Self-pay | Admitting: *Deleted

## 2019-11-13 LAB — CYTOLOGY - NON PAP

## 2019-11-13 NOTE — Telephone Encounter (Signed)
Order placed for rheumatology referral.  Nothing further needed.

## 2019-11-13 NOTE — Telephone Encounter (Signed)
Noted.   [name]

## 2019-11-13 NOTE — Telephone Encounter (Signed)
[facility] this is the first time this has happened where despite adequate sample they said not enough sample to do PCP. Pleae find out how many mL they need for that?  2. 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   Plan (Copying brian)  - will need steroids once cultures are negative. Will probbly have cultures next 1-3 days. If not Amber Winters can start prednisone - still keep appt next week ith [name]

## 2019-11-13 NOTE — Telephone Encounter (Signed)
Received a call from [facility] at [facility]. Pt had BAL performed yesterday 3/22. Per Freda Munro, there was not enough specimen to be able to do the pneumocystis test as they usually send a slide to [facility] for this test.  Routing to MR.

## 2019-11-15 LAB — CULTURE, RESPIRATORY W GRAM STAIN: Culture: NORMAL

## 2019-11-15 LAB — MTB RIF NAA NON-SPUTUM, W/O CULTURE

## 2019-11-15 NOTE — Progress Notes (Signed)
Resp culture negative. Ok to start steroids 2-3 month course. I have asked triage desk to change appt from 11/19/19 to tomorrow 11/16/19

## 2019-11-16 ENCOUNTER — Other Ambulatory Visit: Payer: Self-pay

## 2019-11-16 ENCOUNTER — Encounter: Payer: Self-pay | Admitting: Pulmonary Disease

## 2019-11-16 ENCOUNTER — Ambulatory Visit (INDEPENDENT_AMBULATORY_CARE_PROVIDER_SITE_OTHER): Payer: BC Managed Care – PPO | Admitting: Pulmonary Disease

## 2019-11-16 DIAGNOSIS — J679 Hypersensitivity pneumonitis due to unspecified organic dust: Secondary | ICD-10-CM | POA: Diagnosis not present

## 2019-11-16 DIAGNOSIS — J849 Interstitial pulmonary disease, unspecified: Secondary | ICD-10-CM

## 2019-11-16 DIAGNOSIS — T378X5A Adverse effect of other specified systemic anti-infectives and antiparasitics, initial encounter: Secondary | ICD-10-CM | POA: Insufficient documentation

## 2019-11-16 LAB — ACID FAST SMEAR (AFB, MYCOBACTERIA): Acid Fast Smear: NEGATIVE

## 2019-11-16 MED ORDER — PREDNISONE 10 MG PO TABS: ORAL_TABLET | ORAL | 0 refills | Status: DC
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
## 2019-11-16 NOTE — Patient Instructions (Signed)
You were seen today by Lauraine Rinne, NP  for:   Nice talking with you today.  We will start you on prednisone as we discussed over the phone.  If you have additional questions or concerns please feel free to give [place] a call.  Or send a MyChart message.  If you start having any sort of adverse reactions to prednisone as you have had in the past please let us know.  We will see you back in about 2 months.  Take care and stay safe,  Josetta Wigal  1. Hypersensitivity pneumonitis ([place]) 2. ILD (interstitial lung disease) (HCC)  - predniSONE (DELTASONE) 10 MG tablet; Take 3 tablets (30 mg total) by mouth daily with breakfast for 10 days, THEN 2 tablets (20 mg total) daily with breakfast for 20 days, THEN 1 tablet (10 mg total) daily with breakfast for 20 days.  Dispense: 90 tablet; Refill: 0  Okay to start the prednisone on 11/24/2019 given the fact that you are getting a Covid vaccine on 11/21/2019  We will see back in office in 2 months after you have completed the prednisone taper  Please contact our office if you start to have any symptoms while being on the prednisone  We will likely plan on repeating a CT of your chest in 3 to 6 months  May need to also consider pulmonary function test to further evaluate your breathing   3. Nitrofurantoin adverse reaction, initial encounter  Continue to not take [place] in the future   We recommend today:   Meds ordered this encounter  Medications  . predniSONE (DELTASONE) 10 MG tablet    Sig: Take 3 tablets (30 mg total) by mouth daily with breakfast for 10 days, THEN 2 tablets (20 mg total) daily with breakfast for 20 days, THEN 1 tablet (10 mg total) daily with breakfast for 20 days.    Dispense:  90 tablet    Refill:  0    Follow Up:    Return in about 2 months (around 01/16/2020), or if symptoms worsen or fail to improve, for Follow up with Dr. Purnell Shoemaker - 48min ILD slot .   Please do your part to reduce the spread of  COVID-19:      Reduce your risk of any infection  and COVID19 by using the similar precautions used for avoiding the common cold or flu:  Marland Kitchen Wash your hands often with soap and warm water for at least 20 seconds.  If soap and water are not readily available, use an alcohol-based hand sanitizer with at least 60% alcohol.  . If coughing or sneezing, cover your mouth and nose by coughing or sneezing into the elbow areas of your shirt or coat, into a tissue or into your sleeve (not your hands). Langley Gauss A MASK when in public  . Avoid shaking hands with others and consider head nods or verbal greetings only. . Avoid touching your eyes, nose, or mouth with unwashed hands.  . Avoid close contact with people who are sick. . Avoid places or events with large numbers of people in one location, like concerts or sporting events. . If you have some symptoms but not all symptoms, continue to monitor at home and seek medical attention if your symptoms worsen. . If you are having a medical emergency, call 911.   [place] / e-Visit: [email or web address]         [facility]: [phone]  [facility]: [phone]                   [facility]: [phone]     It is flu season:   >>> Best ways to protect herself from the flu: Receive the yearly flu vaccine, practice good hand hygiene washing with soap and also using hand sanitizer when available, eat a nutritious meals, get adequate rest, hydrate appropriately   Please contact the office if your symptoms worsen or you have concerns that you are not improving.   Thank you for choosing [facility] for your healthcare, and for allowing [place] to partner with you on your healthcare journey. I am thankful to be able to provide care to you today.   [name] FNP-C

## 2019-11-16 NOTE — Assessment & Plan Note (Signed)
ILD Favored hypersensitivity pneumonitis based off of BAL results  Plan: We will start prednisone taper today We will discuss case with Dr. Chase Caller We will plan on repeating CT chest in 3 to 6 months after prednisone treatment Based on the CT imaging will likely need to consider pulmonary function testing

## 2019-11-16 NOTE — Assessment & Plan Note (Signed)
Plan: Prednisone taper as outlined We will plan on repeat CT imaging in 3 to 6 months, high-resolution CT chest 44-month follow-up in office in ILD clinic spot

## 2019-11-16 NOTE — Addendum Note (Signed)
Addended by: Lauraine Rinne on: 11/16/2019 02:38 PM   Modules accepted: Orders

## 2019-11-16 NOTE — Assessment & Plan Note (Signed)
Favored hypersensitivity pneumonitis on CT/bronc results  Plan: Prednisone taper today as outlined Stop Macrobid Ensured Macrobid was already added to patient's allergy list, it has

## 2019-11-19 ENCOUNTER — Ambulatory Visit: Payer: BC Managed Care – PPO | Admitting: Pulmonary Disease

## 2019-11-22 ENCOUNTER — Telehealth: Payer: Self-pay | Admitting: Pulmonary Disease

## 2019-11-22 NOTE — Telephone Encounter (Signed)
Called and spoke with pt stating to her the info per Aaron Edelman and pt verbalized understanding. Also scheduled pt a f/u with MR as she didn't have one scheduled. Nothing further needed.

## 2019-11-22 NOTE — Telephone Encounter (Signed)
That's fine. Ok with pharmacy recs.   Pt needs to start pred taper as outlines 2 weeks after covid shot.   Amber Winters

## 2019-11-22 NOTE — Telephone Encounter (Signed)
Called and spoke with pt. Pt stated when she went to the pharmacy to pick up the prednisone, she was told by the pharmacist that it was optimal to wait until 2 weeks prior to beginning prednisone but she should wait at least 1 week prior to beginning it after receiving the covid vaccine. Pt stated she just received the vaccine yesterday 3/31.  Pt stated that she is feeling better now after being off of macrobid x2 weeks now.  Pt wanted to know [name]'s response in regards to what she was told by pharmacist about the prednisone. Aaron Edelman, please advise.

## 2019-12-05 ENCOUNTER — Other Ambulatory Visit: Payer: Self-pay | Admitting: *Deleted

## 2019-12-05 ENCOUNTER — Telehealth: Payer: Self-pay | Admitting: Nurse Practitioner

## 2019-12-05 DIAGNOSIS — E559 Vitamin D deficiency, unspecified: Secondary | ICD-10-CM

## 2019-12-05 DIAGNOSIS — E785 Hyperlipidemia, unspecified: Secondary | ICD-10-CM

## 2019-12-05 DIAGNOSIS — K219 Gastro-esophageal reflux disease without esophagitis: Secondary | ICD-10-CM

## 2019-12-05 NOTE — Telephone Encounter (Signed)
Orders placed. Patient aware. 

## 2019-12-06 ENCOUNTER — Other Ambulatory Visit: Payer: Self-pay

## 2019-12-06 ENCOUNTER — Other Ambulatory Visit: Payer: BC Managed Care – PPO

## 2019-12-06 DIAGNOSIS — K219 Gastro-esophageal reflux disease without esophagitis: Secondary | ICD-10-CM

## 2019-12-06 DIAGNOSIS — E785 Hyperlipidemia, unspecified: Secondary | ICD-10-CM

## 2019-12-06 DIAGNOSIS — E559 Vitamin D deficiency, unspecified: Secondary | ICD-10-CM

## 2019-12-07 LAB — LIPID PANEL
Chol/HDL Ratio: 3.1 ratio (ref 0.0–4.4)
Cholesterol, Total: 223 mg/dL — ABNORMAL HIGH (ref 100–199)
HDL: 73 mg/dL (ref >39)
LDL Chol Calc (NIH): 136 mg/dL — ABNORMAL HIGH (ref 0–99)
Triglycerides: 78 mg/dL (ref 0–149)
VLDL Cholesterol Cal: 14 mg/dL (ref 5–40)

## 2019-12-07 LAB — CBC WITH DIFFERENTIAL/PLATELET
Basophils Absolute: 0 10*3/uL (ref 0.0–0.2)
Basos: 0 %
EOS (ABSOLUTE): 0.1 10*3/uL (ref 0.0–0.4)
Eos: 1 %
Hematocrit: 39.3 % (ref 34.0–46.6)
Hemoglobin: 13.1 g/dL (ref 11.1–15.9)
Immature Grans (Abs): 0 10*3/uL (ref 0.0–0.1)
Immature Granulocytes: 0 %
Lymphocytes Absolute: 4.1 10*3/uL — ABNORMAL HIGH (ref 0.7–3.1)
Lymphs: 44 %
MCH: 31.9 pg (ref 26.6–33.0)
MCHC: 33.3 g/dL (ref 31.5–35.7)
MCV: 96 fL (ref 79–97)
Monocytes Absolute: 0.6 10*3/uL (ref 0.1–0.9)
Monocytes: 7 %
Neutrophils Absolute: 4.5 10*3/uL (ref 1.4–7.0)
Neutrophils: 48 %
Platelets: 231 10*3/uL (ref 150–450)
RBC: 4.11 x10E6/uL (ref 3.77–5.28)
RDW: 12.8 % (ref 11.7–15.4)
WBC: 9.4 10*3/uL (ref 3.4–10.8)

## 2019-12-07 LAB — CMP14+EGFR
ALT: 38 IU/L — ABNORMAL HIGH (ref 0–32)
AST: 35 IU/L (ref 0–40)
Albumin/Globulin Ratio: 1.1 — ABNORMAL LOW (ref 1.2–2.2)
Albumin: 4.1 g/dL (ref 3.8–4.8)
Alkaline Phosphatase: 81 IU/L (ref 39–117)
BUN/Creatinine Ratio: 31 — ABNORMAL HIGH (ref 12–28)
BUN: 23 mg/dL (ref 8–27)
Bilirubin Total: 1.2 mg/dL (ref 0.0–1.2)
CO2: 23 mmol/L (ref 20–29)
Calcium: 9.6 mg/dL (ref 8.7–10.3)
Chloride: 103 mmol/L (ref 96–106)
Creatinine, Ser: 0.75 mg/dL (ref 0.57–1.00)
GFR calc Af Amer: 98 mL/min/{1.73_m2} (ref >59)
GFR calc non Af Amer: 85 mL/min/{1.73_m2} (ref >59)
Globulin, Total: 3.7 g/dL (ref 1.5–4.5)
Glucose: 69 mg/dL (ref 65–99)
Potassium: 3.9 mmol/L (ref 3.5–5.2)
Sodium: 141 mmol/L (ref 134–144)
Total Protein: 7.8 g/dL (ref 6.0–8.5)

## 2019-12-07 LAB — VITAMIN D 25 HYDROXY (VIT D DEFICIENCY, FRACTURES): Vit D, 25-Hydroxy: 31.3 ng/mL (ref 30.0–100.0)

## 2019-12-10 MED ORDER — ATORVASTATIN CALCIUM 40 MG PO TABS: 40.0000 mg | ORAL_TABLET | Freq: Every day | ORAL | 1 refills | Status: DC

## 2019-12-13 ENCOUNTER — Ambulatory Visit (INDEPENDENT_AMBULATORY_CARE_PROVIDER_SITE_OTHER): Payer: BC Managed Care – PPO | Admitting: Nurse Practitioner

## 2019-12-13 ENCOUNTER — Encounter: Payer: Self-pay | Admitting: Nurse Practitioner

## 2019-12-13 DIAGNOSIS — J849 Interstitial pulmonary disease, unspecified: Secondary | ICD-10-CM | POA: Diagnosis not present

## 2019-12-13 DIAGNOSIS — E785 Hyperlipidemia, unspecified: Secondary | ICD-10-CM

## 2019-12-13 DIAGNOSIS — E559 Vitamin D deficiency, unspecified: Secondary | ICD-10-CM | POA: Diagnosis not present

## 2019-12-13 DIAGNOSIS — K219 Gastro-esophageal reflux disease without esophagitis: Secondary | ICD-10-CM

## 2019-12-13 LAB — FUNGUS CULTURE WITH STAIN

## 2019-12-13 LAB — FUNGAL ORGANISM REFLEX

## 2019-12-13 LAB — FUNGUS CULTURE RESULT

## 2019-12-13 NOTE — Progress Notes (Signed)
Subjective:    Patient ID: Amber Winters, female    DOB: September 10, 1955, 64 y.o.   MRN: [id number]   Chief Complaint: Medical Management of Chronic Issues    HPI:  1. Gastroesophageal reflux disease, unspecified whether esophagitis present Is not on any meds right now and is doing well.  2. Hyperlipidemia with target LDL less than 100 She is watchcing diet and does exercise. Her liver exzymes were elevate dat last visit so we stopped her lipitor. Now LFT's are normal Lab Results  Component Value Date   CHOL 223 (H) 12/06/2019   HDL 73 12/06/2019   LDLCALC 136 (H) 12/06/2019   TRIG 78 12/06/2019   CHOLHDL 3.1 12/06/2019   Lab Results  Component Value Date   ALT 38 (H) 12/06/2019   AST 35 12/06/2019   ALKPHOS 81 12/06/2019   BILITOT 1.2 12/06/2019     3. Vitamin D deficiency Takes daily vitamin d supplement  4. ILD (interstitial lung disease) ([place]) Is much better since stopping macrobid. She denies cough and says she feels the best she has ever felt. Sees pulmonology.    Outpatient Encounter Medications as of 12/13/2019  Medication Sig  . atorvastatin (LIPITOR) 40 MG tablet Take 1 tablet (40 mg total) by mouth daily.  . cephALEXin (KEFLEX) 250 MG capsule Take 250 mg by mouth daily.  . COLLAGEN PO Take 1 Scoop by mouth daily. Peptide  . Glycerin-Hypromellose-PEG 400 (DRY EYE RELIEF DROPS) 0.2-0.2-1 % SOLN Place 1 drop into both eyes daily as needed (dry eye).  . Multiple Minerals-Vitamins (CAL-MAG-ZINC-D PO) Take 1 tablet by mouth daily.  . phentermine (ADIPEX-P) 37.5 MG tablet Take 1 tablet (37.5 mg total) by mouth daily before breakfast. (Patient not taking: Reported on 11/09/2019)  . predniSONE (DELTASONE) 10 MG tablet Take 3 tablets (30 mg total) by mouth daily with breakfast for 10 days, THEN 2 tablets (20 mg total) daily with breakfast for 20 days, THEN 1 tablet (10 mg total) daily with breakfast for 20 days.  . vitamin C (ASCORBIC ACID) 500 MG tablet Take 500 mg by  mouth daily.  Marland Kitchen VITAMIN D PO Take 2,000 Units by mouth daily.      Past Surgical History:  Procedure Laterality Date  . ABDOMINAL HYSTERECTOMY    . BRONCHIAL WASHINGS  11/12/2019   Procedure: BRONCHIAL WASHINGS;  Surgeon: Brand Males, MD;  Location: [facility];  Service: Endoscopy;;  . CATARACT EXTRACTION, BILATERAL    . CHOLECYSTECTOMY    . HERNIA REPAIR    . VIDEO BRONCHOSCOPY  11/12/2019   Procedure: VIDEO BRONCHOSCOPY WITH FLUORO;  Surgeon: Brand Males, MD;  Location: [facility];  Service: Endoscopy;;    Family History  Problem Relation Age of Onset  . Cancer Mother   . Anuerysm Father   . Hypertension Brother     New complaints: None today  Social history: Lives by herself. 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 She is helping take care of her grandchildren  Controlled substance contract: n/a    Review of Systems  Constitutional: Negative for diaphoresis.  Eyes: Negative for pain.  Respiratory: Negative for shortness of breath.   Cardiovascular: Negative for chest pain, palpitations and leg swelling.  Gastrointestinal: Negative for abdominal pain.  Endocrine: Negative for polydipsia.  Skin: Negative for rash.  Neurological: Negative for dizziness, weakness and headaches.  Hematological: Does not bruise/bleed easily.  All other systems reviewed and are negative.      Objective:  Physical Exam Vitals and nursing note reviewed.  Constitutional:      General: She is not in acute distress.    Appearance: Normal appearance. She is well-developed.  HENT:     Head: Normocephalic.     Nose: Nose normal.  Eyes:     Pupils: Pupils are equal, round, and reactive to light.  Neck:     Vascular: No carotid bruit or JVD.  Cardiovascular:     Rate and Rhythm: Normal rate and regular rhythm.     Heart sounds: Normal heart sounds.  Pulmonary:     Effort: Pulmonary effort is normal. No respiratory distress.     Breath sounds:  Normal breath sounds. No wheezing or rales.  Chest:     Chest wall: No tenderness.  Abdominal:     General: Bowel sounds are normal. There is no distension or abdominal bruit.     Palpations: Abdomen is soft. There is no hepatomegaly, splenomegaly, mass or pulsatile mass.     Tenderness: There is no abdominal tenderness.  Musculoskeletal:        General: Normal range of motion.     Cervical back: Normal range of motion and neck supple.  Lymphadenopathy:     Cervical: No cervical adenopathy.  Skin:    General: Skin is warm and dry.  Neurological:     Mental Status: She is alert and oriented to person, place, and time.     Deep Tendon Reflexes: Reflexes are normal and symmetric.  Psychiatric:        Behavior: Behavior normal.        Thought Content: Thought content normal.        Judgment: Judgment normal.           Assessment & Plan:  Amber Winters in today with chief complaint of Medical Management of Chronic Issues   1. Gastroesophageal reflux disease, unspecified whether esophagitis present Avoid spicy foods Do not eat 2 hours prior to bedtime  2. Hyperlipidemia with target LDL less than 100 Discussed labs Will add red yeast rice  3. Vitamin D deficiency Daily vitan=min d supplement  4. ILD (interstitial lung disease) ([place]) Keep follow up with pulmonology    The above assessment and management plan was discussed with the patient. The patient verbalized understanding of and has agreed to the management plan. Patient is aware to call the clinic if symptoms persist or worsen. Patient is aware when to return to the clinic for a follow-up visit. Patient educated on when it is appropriate to go to the emergency department.   Mary-Margaret Hassell Done, FNP

## 2019-12-16 ENCOUNTER — Other Ambulatory Visit: Payer: Self-pay | Admitting: Pulmonary Disease

## 2019-12-16 DIAGNOSIS — J849 Interstitial pulmonary disease, unspecified: Secondary | ICD-10-CM

## 2019-12-17 MED ORDER — PREDNISONE 10 MG PO TABS: 10.0000 mg | ORAL_TABLET | Freq: Every day | ORAL | 1 refills | Status: DC

## 2019-12-17 NOTE — Telephone Encounter (Signed)
Will cancel order that sent in today and change to 10mg  daily until her next visit. New RX has been sent to pharmacy.   Nothing further needed at time of call.

## 2019-12-17 NOTE — Addendum Note (Signed)
Addended by: Valerie Salts on: 12/17/2019 09:43 AM   Modules accepted: Orders

## 2019-12-17 NOTE — Telephone Encounter (Signed)
Received a refill request for prednisone 10mg  from patient's pharmacy. She was last seen on 11/16/19 for a televisit. The following is from her AVS.   1. Hypersensitivity pneumonitis ([place]) 2. ILD (interstitial lung disease) (HCC)  - predniSONE (DELTASONE) 10 MG tablet; Take 3 tablets (30 mg total) by mouth daily with breakfast for 10 days, THEN 2 tablets (20 mg total) daily with breakfast for 20 days, THEN 1 tablet (10 mg total) daily with breakfast for 20 days.  Dispense: 90 tablet; Refill: 0  Okay to start the prednisone on 11/24/2019 given the fact that you are getting a Covid vaccine on 11/21/2019  We will see back in office in 2 months after you have completed the prednisone taper  Please contact our office if you start to have any symptoms while being on the prednisone  We will likely plan on repeating a CT of your chest in 3 to 6 months  May need to also consider pulmonary function test to further evaluate your breathing.    Aaron Edelman, please advise if she should be on 10mg  of prednisone until her next appt or if you wanted her to stop after she finished her taper. Thanks!

## 2019-12-17 NOTE — Telephone Encounter (Signed)
Yes. Remain on it.  Amber Winters

## 2019-12-25 ENCOUNTER — Telehealth: Payer: Self-pay | Admitting: Internal Medicine

## 2019-12-25 NOTE — Telephone Encounter (Signed)
Spoke with patient. She is aware that she is ok to restart the multivitamins but not the fish oil capsules. Verbalized understanding.   Nothing further needed at time of call.

## 2019-12-25 NOTE — Telephone Encounter (Signed)
Called and spoke with pt. Pt stated she had to stop taking her vitamins as she was scheduled for the bronch. Pt wants to know if it is okay for her to begin them again as she has already had the bronch and wants to get started back on them if she is able.  MR, please advise.

## 2019-12-25 NOTE — Telephone Encounter (Signed)
Okay to restart multivitamins but should not take fish oil because of the propensity for acid reflux.  Moreover, the beneficial effects of fish oil is not well-established

## 2019-12-28 LAB — ACID FAST CULTURE WITH REFLEXED SENSITIVITIES (MYCOBACTERIA): Acid Fast Culture: NEGATIVE

## 2020-01-17 ENCOUNTER — Ambulatory Visit: Payer: BC Managed Care – PPO | Admitting: Internal Medicine

## 2020-01-17 ENCOUNTER — Other Ambulatory Visit: Payer: Self-pay

## 2020-01-17 VITALS — BP 138/88 | HR 77 | Temp 98.4°F | Resp 18 | Ht 59.0 in | Wt 147.8 lb

## 2020-01-17 DIAGNOSIS — K449 Diaphragmatic hernia without obstruction or gangrene: Secondary | ICD-10-CM

## 2020-01-17 DIAGNOSIS — J679 Hypersensitivity pneumonitis due to unspecified organic dust: Secondary | ICD-10-CM

## 2020-01-17 DIAGNOSIS — K219 Gastro-esophageal reflux disease without esophagitis: Secondary | ICD-10-CM

## 2020-01-17 DIAGNOSIS — R768 Other specified abnormal immunological findings in serum: Secondary | ICD-10-CM

## 2020-01-17 DIAGNOSIS — T378X5A Adverse effect of other specified systemic anti-infectives and antiparasitics, initial encounter: Secondary | ICD-10-CM

## 2020-01-17 DIAGNOSIS — Z5181 Encounter for therapeutic drug level monitoring: Secondary | ICD-10-CM

## 2020-01-17 DIAGNOSIS — Z77120 Contact with and (suspected) exposure to mold (toxic): Secondary | ICD-10-CM | POA: Diagnosis not present

## 2020-01-17 DIAGNOSIS — J849 Interstitial pulmonary disease, unspecified: Secondary | ICD-10-CM | POA: Diagnosis not present
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
## 2020-01-17 NOTE — Patient Instructions (Addendum)
    ICD-10-CM   1. ILD (interstitial lung disease) ([place])  J84.9   2. 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   4. Nitrofurantoin adverse reaction, initial encounter  T37.8X5A   5. Hiatal hernia with GERD  K21.9    K44.9   6. PR3 ANCA antibodies present  R76.8   7. Encounter for therapeutic drug monitoring  Z51.81    I think at this point your diagnosis sounds more like nitrofurantoin and mold exposure related interstitial lung disease -specifically called hypersensitivity pneumonitis  I think the prednisone is helping but you are having side effects from it especially at any dose greater than 20 mg.  The side effects include short temper and increased appetite  Nevertheless we discussed that overall prednisone is beneficial  I am glad you have gotten rid of mold in the house since the discovery.  Glad to hear that visit with Dr. Gavin Pound at rheumatology was reassuring although there is some antibodies that are positive which require monitoring  Plan -Do not enter the church building till all the mold has been cleaned out.  Even brief exposure is can be harmful to the lung  -Do not ever go back on nitrofurantoin  -Continue prednisone at 10 mg/day for the time being  -Do high-resolution CT chest supine and prone inspiratory and expiratory volume in mid August 2021  -Do spirometry and DLCO in mid August 2021  -Ensure good acid reflux control  -Avoid any mold exposure  Follow-up -Report any concerns with prednisone in the interim to Dr. Chase Caller via telephone or make a sooner appointment  -Return in mid August 2021 to see Dr. Chase Caller but after testing spirometry and CT chest

## 2020-01-29 DIAGNOSIS — Z0289 Encounter for other administrative examinations: Secondary | ICD-10-CM

## 2020-02-13 ENCOUNTER — Telehealth: Payer: Self-pay | Admitting: Internal Medicine

## 2020-02-13 ENCOUNTER — Other Ambulatory Visit: Payer: Self-pay | Admitting: Pulmonary Disease

## 2020-02-13 MED ORDER — PREDNISONE 10 MG PO TABS: 10.0000 mg | ORAL_TABLET | Freq: Every day | ORAL | 1 refills | Status: DC

## 2020-02-13 NOTE — Telephone Encounter (Signed)
Rx for prednisone sent. Pt made aware.    Lauraine Rinne, NP to [facility] Triage Pool    02/13/20 12:15 PM Note   Last note from Dr. Chase Caller is: -Continue prednisone at 10 mg/day for the time being   This is from his May/2021 office visit.I would place the order for 10 mg of prednisone under his name.  Send this to him as an Pharmacist, hospital.  Make sure the patient has follow-up with our office.[name], FNP

## 2020-02-13 NOTE — Telephone Encounter (Signed)
Pt is request refill on prednisone looks like was a taper lov 01/17/2020 is it ok to fill

## 2020-02-13 NOTE — Telephone Encounter (Deleted)
      I think at this point your diagnosis sounds more like nitrofurantoin and mold exposure related interstitial lung disease -specifically called hypersensitivity pneumonitis  I think the prednisone is helping but you are having side effects from it especially at any dose greater than 20 mg.  The side effects include short temper and increased appetite  Nevertheless we discussed that overall prednisone is beneficial  I am glad you have gotten rid of mold in the house since the discovery.  Glad to hear that visit with Dr. Gavin Pound at rheumatology was reassuring although there is some antibodies that are positive which require monitoring  Plan -Do not enter the church building till all the mold has been cleaned out.  Even brief exposure is can be harmful to the lung  -Do not ever go back on nitrofurantoin  -Continue prednisone at 10 mg/day for the time being  -Do high-resolution CT chest supine and prone inspiratory and expiratory volume in mid August 2021  -Do spirometry and DLCO in mid August 2021  -Ensure good acid reflux control  -Avoid any mold exposure  Follow-up -Report any concerns with prednisone in the interim to Dr. Chase Caller via telephone or make a sooner appointment  -Return in mid August 2021 to see Dr. Chase Caller but after testing spirometry and CT chest

## 2020-02-13 NOTE — Telephone Encounter (Signed)
Patient needs refill for Prednisone. Pharmacy is [facility] . Patient going out of town for over 2 weeks. Patient phone number is [phone].

## 2020-02-13 NOTE — Telephone Encounter (Signed)
Last note from Dr. Chase Caller is: -Continue prednisone at 10 mg/day for the time being   This is from his May/2021 office visit.I would place the order for 10 mg of prednisone under his name.  Send this to him as an Pharmacist, hospital.  Make sure the patient has follow-up with our office.Amber Quaker, FNP

## 2020-03-10 ENCOUNTER — Telehealth: Payer: Self-pay | Admitting: Pulmonary Disease

## 2020-03-10 DIAGNOSIS — J849 Interstitial pulmonary disease, unspecified: Secondary | ICD-10-CM

## 2020-03-10 NOTE — Telephone Encounter (Signed)
I do not see any order for any Ct's placed by [place] if we need to schedule one we will need a order

## 2020-03-10 NOTE — Telephone Encounter (Signed)
Per May 2021 ov note- -Do high-resolution CT chest supine and prone inspiratory and expiratory volume in mid August 2021  Order placed

## 2020-03-25 ENCOUNTER — Other Ambulatory Visit (HOSPITAL_COMMUNITY): Payer: BC Managed Care – PPO

## 2020-03-28 ENCOUNTER — Ambulatory Visit (INDEPENDENT_AMBULATORY_CARE_PROVIDER_SITE_OTHER): Payer: BC Managed Care – PPO | Admitting: Internal Medicine

## 2020-03-28 ENCOUNTER — Other Ambulatory Visit: Payer: Self-pay

## 2020-03-28 DIAGNOSIS — J849 Interstitial pulmonary disease, unspecified: Secondary | ICD-10-CM

## 2020-03-28 LAB — PULMONARY FUNCTION TEST
DL/VA % pred: 111 %
DL/VA: 4.82 ml/min/mmHg/L
DLCO cor % pred: 105 %
DLCO cor: 18.06 ml/min/mmHg
DLCO unc % pred: 105 %
DLCO unc: 18.06 ml/min/mmHg
FEF 25-75 Pre: 3.1 L/sec
FEF2575-%Pred-Pre: 157 %
FEV1-%Pred-Pre: 105 %
FEV1-Pre: 2.17 L
FEV1FVC-%Pred-Pre: 111 %
FEV6-%Pred-Pre: 96 %
FEV6-Pre: 2.5 L
FEV6FVC-%Pred-Pre: 103 %
FVC-%Pred-Pre: 93 %
FVC-Pre: 2.52 L
Pre FEV1/FVC ratio: 86 %
Pre FEV6/FVC Ratio: 100 %

## 2020-03-28 NOTE — Progress Notes (Signed)
Spirometry and Dlco done today. 

## 2020-04-07 ENCOUNTER — Other Ambulatory Visit: Payer: Self-pay | Admitting: Internal Medicine

## 2020-04-08 NOTE — Progress Notes (Signed)
Spoke with patient, unable to schedule patient as schedule did not work for patient and his schedule is not out for September.  I advised her to call back the end of August to schedule a September appointment.  She has her CT scan on 04/09/20.  She will only be available on Fridays after August 17th as she will be working.

## 2020-04-09 ENCOUNTER — Ambulatory Visit (HOSPITAL_COMMUNITY)
Admission: RE | Admit: 2020-04-09 | Discharge: 2020-04-09 | Disposition: A | Discharge status: Home / Self Care | Payer: BC Managed Care – PPO | Source: Ambulatory Visit | Attending: Internal Medicine | Admitting: Internal Medicine

## 2020-04-09 ENCOUNTER — Other Ambulatory Visit: Payer: Self-pay

## 2020-04-09 ENCOUNTER — Telehealth: Payer: Self-pay | Admitting: Internal Medicine

## 2020-04-09 DIAGNOSIS — J849 Interstitial pulmonary disease, unspecified: Secondary | ICD-10-CM | POA: Diagnosis present

## 2020-04-09 NOTE — Telephone Encounter (Signed)
Called and spoke with Olivia Mackie from [facility] she was calling report on CT Chest that patient had done today.  Dr. Chase Caller CT is resulted with findings   IMPRESSION: 1. Interval clearing of previously seen peribronchovascular ground-glass, indicative of a resolved infectious or inflammatory process. 2. No evidence of interstitial lung disease. 3. New area of masslike low-attenuation in the left hepatic lobe with left hepatic lobe atrophy. Further evaluation with MR abdomen without and with contrast is recommended as malignancy cannot be excluded. These results will be called to the ordering clinician or representative by the Radiologist Assistant, and communication documented in the PACS or [employer]. 4. 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 5. Large hiatal hernia.

## 2020-04-09 NOTE — Telephone Encounter (Signed)
    Pls do the following  1. Inform patient PCP Chevis Pretty, [place] office directly and send results via fax to office and confirm via phone someone picked up that fax and also document the name of the person. The CT reports needs to get to PCP.   2. The liver lesion - PCP should handle. LEt patient knowt hat something abnormal in liver and we cannot comment because we are pulmonary and to follow with PCP   3  Let patient know lung findings have improved  4 Give patient followup baed on recent OV - likely in oct

## 2020-04-09 NOTE — Telephone Encounter (Signed)
Called and spoke with patient letting her know that we got her CT scan back from today and that we are going to send it to her PCP to follow up on for abnormal finding on liver. Asked patient to call and touch base with her PCP to see how they want to proceed. Called PCP office and spoke with Santiago Glad and she confirmed they received it. Patient is now scheduled to see Dr. Chase Caller on 9/9. Nothing further needed at this time

## 2020-04-13 NOTE — Progress Notes (Signed)
Results were given. Talking to her 05/01/20

## 2020-04-21 ENCOUNTER — Other Ambulatory Visit: Payer: Self-pay

## 2020-04-21 ENCOUNTER — Encounter: Payer: Self-pay | Admitting: Nurse Practitioner

## 2020-04-21 ENCOUNTER — Ambulatory Visit: Payer: BC Managed Care – PPO | Admitting: Nurse Practitioner

## 2020-04-21 VITALS — BP 167/88 | HR 105 | Temp 97.6°F | Resp 20 | Ht 59.0 in

## 2020-04-21 DIAGNOSIS — K769 Liver disease, unspecified: Secondary | ICD-10-CM | POA: Diagnosis not present

## 2020-04-21 DIAGNOSIS — R160 Hepatomegaly, not elsewhere classified: Secondary | ICD-10-CM | POA: Diagnosis not present

## 2020-04-21 NOTE — Progress Notes (Signed)
   Subjective:    Patient ID: Amber Winters, female    DOB: 19-Dec-1955, 64 y.o.   MRN: [id number]   Chief Complaint: Follow up from pulmonology   HPI Patient had some glass like opacities on chest xray was was sent to pulmonology. They did bronchoscopy and repeat CT scan. Repeat CT came back normal and she has a follow up with piulmonology next week to discuss PF studiy results. On CT there was something seenon her liver and they wanted it checked out with dedicated MRI of liver. Patient is having no pain, nausea or vomiting. No yellow skin discoloration   Review of Systems  Constitutional: Negative for diaphoresis.  Eyes: Negative for pain.  Respiratory: Negative for shortness of breath.   Cardiovascular: Negative for chest pain, palpitations and leg swelling.  Gastrointestinal: Negative for abdominal pain.  Endocrine: Negative for polydipsia.  Skin: Negative for rash.  Neurological: Negative for dizziness, weakness and headaches.  Hematological: Does not bruise/bleed easily.  All other systems reviewed and are negative.      Objective:   Physical Exam Vitals and nursing note reviewed.  Constitutional:      Appearance: Normal appearance.  Cardiovascular:     Rate and Rhythm: Normal rate and regular rhythm.     Heart sounds: Normal heart sounds.  Pulmonary:     Breath sounds: Normal breath sounds.  Skin:    General: Skin is warm.  Neurological:     General: No focal deficit present.     Mental Status: She is alert and oriented to person, place, and time.  Psychiatric:        Mood and Affect: Mood normal.        Behavior: Behavior normal.    BP (!) 167/88   Pulse (!) 105   Temp 97.6 F (36.4 C) (Temporal)   Resp 20   Ht $R'4\' 11"'Wq$  (1.499 m)   SpO2 99%   BMI 29.85 kg/m         Assessment & Plan:  Amber Winters in today with chief complaint of Follow up from pulmonology   1. Liver mass, left lobe - CBC with Differential/Platelet - CMP14+EGFR - MR Abdomen W Wo  Contrast; Future  2. Liver disease - MR Abdomen W Wo Contrast; Future    The above assessment and management plan was discussed with the patient. The patient verbalized understanding of and has agreed to the management plan. Patient is aware to call the clinic if symptoms persist or worsen. Patient is aware when to return to the clinic for a follow-up visit. Patient educated on when it is appropriate to go to the emergency department.   Mary-Margaret Hassell Done, FNP

## 2020-04-22 ENCOUNTER — Ambulatory Visit (HOSPITAL_COMMUNITY)
Admission: RE | Admit: 2020-04-22 | Discharge: 2020-04-22 | Disposition: A | Discharge status: Home / Self Care | Payer: BC Managed Care – PPO | Source: Ambulatory Visit | Attending: Nurse Practitioner | Admitting: Nurse Practitioner

## 2020-04-22 DIAGNOSIS — R160 Hepatomegaly, not elsewhere classified: Secondary | ICD-10-CM | POA: Diagnosis not present

## 2020-04-22 DIAGNOSIS — K769 Liver disease, unspecified: Secondary | ICD-10-CM | POA: Insufficient documentation

## 2020-04-22 LAB — CMP14+EGFR
ALT: 23 IU/L (ref 0–32)
AST: 29 IU/L (ref 0–40)
Albumin/Globulin Ratio: 1.8 (ref 1.2–2.2)
Albumin: 4.5 g/dL (ref 3.8–4.8)
Alkaline Phosphatase: 60 IU/L (ref 48–121)
BUN/Creatinine Ratio: 20 (ref 12–28)
BUN: 17 mg/dL (ref 8–27)
Bilirubin Total: 1.4 mg/dL — ABNORMAL HIGH (ref 0.0–1.2)
CO2: 25 mmol/L (ref 20–29)
Calcium: 10.4 mg/dL — ABNORMAL HIGH (ref 8.7–10.3)
Chloride: 102 mmol/L (ref 96–106)
Creatinine, Ser: 0.85 mg/dL (ref 0.57–1.00)
GFR calc Af Amer: 84 mL/min/{1.73_m2} (ref >59)
GFR calc non Af Amer: 73 mL/min/{1.73_m2} (ref >59)
Globulin, Total: 2.5 g/dL (ref 1.5–4.5)
Glucose: 120 mg/dL — ABNORMAL HIGH (ref 65–99)
Potassium: 4.2 mmol/L (ref 3.5–5.2)
Sodium: 141 mmol/L (ref 134–144)
Total Protein: 7 g/dL (ref 6.0–8.5)

## 2020-04-22 LAB — CBC WITH DIFFERENTIAL/PLATELET
Basophils Absolute: 0 10*3/uL (ref 0.0–0.2)
Basos: 0 %
EOS (ABSOLUTE): 0 10*3/uL (ref 0.0–0.4)
Eos: 0 %
Hematocrit: 43.9 % (ref 34.0–46.6)
Hemoglobin: 14.6 g/dL (ref 11.1–15.9)
Immature Grans (Abs): 0 10*3/uL (ref 0.0–0.1)
Immature Granulocytes: 0 %
Lymphocytes Absolute: 2.2 10*3/uL (ref 0.7–3.1)
Lymphs: 23 %
MCH: 32.1 pg (ref 26.6–33.0)
MCHC: 33.3 g/dL (ref 31.5–35.7)
MCV: 97 fL (ref 79–97)
Monocytes Absolute: 0.6 10*3/uL (ref 0.1–0.9)
Monocytes: 6 %
Neutrophils Absolute: 6.9 10*3/uL (ref 1.4–7.0)
Neutrophils: 71 %
Platelets: 307 10*3/uL (ref 150–450)
RBC: 4.55 x10E6/uL (ref 3.77–5.28)
RDW: 11.7 % (ref 11.7–15.4)
WBC: 9.8 10*3/uL (ref 3.4–10.8)

## 2020-04-22 MED ORDER — GADOBUTROL 1 MMOL/ML IV SOLN
6.0000 mL | Freq: Once | INTRAVENOUS | Status: AC | PRN
  Administered 2020-04-22: 6 mL via INTRAVENOUS

## 2020-04-24 ENCOUNTER — Telehealth: Payer: Self-pay | Admitting: Nurse Practitioner

## 2020-04-24 ENCOUNTER — Other Ambulatory Visit: Payer: Self-pay | Admitting: Nurse Practitioner

## 2020-04-24 DIAGNOSIS — K769 Liver disease, unspecified: Secondary | ICD-10-CM

## 2020-04-24 NOTE — Telephone Encounter (Signed)
Patient aware of results.

## 2020-04-24 NOTE — Telephone Encounter (Signed)
Please review MRI and advise

## 2020-04-30 ENCOUNTER — Encounter (HOSPITAL_COMMUNITY): Payer: Self-pay

## 2020-04-30 ENCOUNTER — Other Ambulatory Visit: Payer: Self-pay

## 2020-04-30 ENCOUNTER — Inpatient Hospital Stay (HOSPITAL_COMMUNITY): Payer: BC Managed Care – PPO | Attending: Hematology | Admitting: Hematology

## 2020-04-30 ENCOUNTER — Encounter (HOSPITAL_COMMUNITY): Payer: Self-pay | Admitting: Hematology

## 2020-04-30 VITALS — BP 148/85 | HR 94 | Temp 96.8°F | Resp 18 | Ht 59.0 in | Wt 152.8 lb

## 2020-04-30 DIAGNOSIS — Z8249 Family history of ischemic heart disease and other diseases of the circulatory system: Secondary | ICD-10-CM | POA: Insufficient documentation

## 2020-04-30 DIAGNOSIS — Z7952 Long term (current) use of systemic steroids: Secondary | ICD-10-CM | POA: Insufficient documentation

## 2020-04-30 DIAGNOSIS — Z8041 Family history of malignant neoplasm of ovary: Secondary | ICD-10-CM | POA: Insufficient documentation

## 2020-04-30 DIAGNOSIS — R160 Hepatomegaly, not elsewhere classified: Secondary | ICD-10-CM

## 2020-04-30 DIAGNOSIS — K769 Liver disease, unspecified: Secondary | ICD-10-CM | POA: Diagnosis present

## 2020-04-30 NOTE — Progress Notes (Signed)
[name], [place] [address]   CLINIC:  Medical Oncology/Hematology  CONSULT NOTE  Patient Care Team: Chevis Pretty, FNP as PCP - General (Nurse Practitioner) Dishmon, Garwin Brothers, RN as Oncology Nurse Navigator (Oncology)  CHIEF COMPLAINTS/PURPOSE OF CONSULTATION:  Evaluation of left liver lobe mass  HISTORY OF PRESENTING ILLNESS:  [name] 64 y.o. female is here because of evaluation of left liver lobe mass, at the request of Mary-Margaret Hassell Done, NP from [facility].  Today she is accompanied by her husband, [name]. She reports that she has never had liver problems before and denies having any abdominal pain. She has never had blood transfusions and never been exposed to hepatitis. She has been taking prednisone 10 mg since March 2021 for interstitial lung disease and gained 25 lbs since then. She reports that she gets recurrent UTI's and would take Macrobid often, but stopped since it was suspected of causing her ILD. She reports that her breathing improved over the last couple month. She denies having any recent F/C or night sweats. She denies having any history of cancer. She denies having any MI's or CVA's. Prior to her cholecystectomy, she denies having history of gall stones.  She is a non-smoker. Her mother had ovarian cancer in her 69s and still living; her father had prostate cancer; paternal GF died from cirrhosis at 70 after chronically consuming his own moonshine. She works as a Aeronautical engineer.  MEDICAL HISTORY:  Past Medical History:  Diagnosis Date  . Hyperlipidemia     SURGICAL HISTORY: Past Surgical History:  Procedure Laterality Date  . ABDOMINAL HYSTERECTOMY  2000  . BRONCHIAL WASHINGS  11/12/2019   Procedure: BRONCHIAL WASHINGS;  Surgeon: Brand Males, MD;  Location: [facility];  Service: Endoscopy;;  . CATARACT EXTRACTION, BILATERAL    . CHOLECYSTECTOMY  2001  . HERNIA REPAIR    . URETHRAL SLING    .  VIDEO BRONCHOSCOPY  11/12/2019   Procedure: VIDEO BRONCHOSCOPY WITH FLUORO;  Surgeon: Brand Males, MD;  Location: [facility];  Service: Endoscopy;;    SOCIAL HISTORY: Social History   Socioeconomic History  . Marital status: Married    Spouse name: Not on file  . Number of children: 2  . Years of education: Not on file  . Highest education level: Not on file  Occupational History  . Not on file  Tobacco Use  . Smoking status: Never Smoker  . Smokeless tobacco: Never Used  Vaping Use  . Vaping Use: Never used  Substance and Sexual Activity  . Alcohol use: No  . Drug use: No  . Sexual activity: Not on file  Other Topics Concern  . Not on file  Social History Narrative  . Not on file   Social Determinants of Health   Financial Resource Strain:   . Difficulty of Paying Living Expenses: Not on file  Food Insecurity:   . Worried About Charity fundraiser in the Last Year: Not on file  . Ran Out of Food in the Last Year: Not on file  Transportation Needs:   . Lack of Transportation (Medical): Not on file  . Lack of Transportation (Non-Medical): Not on file  Physical Activity:   . Days of Exercise per Week: Not on file  . Minutes of Exercise per Session: Not on file  Stress:   . Feeling of Stress : Not on file  Social Connections:   . Frequency of Communication with Friends and Family: Not  on file  . Frequency of Social Gatherings with Friends and Family: Not on file  . Attends Religious Services: Not on file  . Active Member of Clubs or Organizations: Not on file  . Attends Archivist Meetings: Not on file  . Marital Status: Not on file  Intimate Partner Violence:   . Fear of Current or Ex-Partner: Not on file  . Emotionally Abused: Not on file  . Physically Abused: Not on file  . Sexually Abused: Not on file    FAMILY HISTORY: Family History  Problem Relation Age of Onset  . Ovarian cancer Mother   . Anuerysm Father   . Hypertension Brother     . Healthy Daughter   . Healthy Daughter     ALLERGIES:  is allergic to ciprofloxacin, nitrofurantoin, asa [aspirin], sulfa antibiotics, and zithromax [azithromycin].  MEDICATIONS:  Current Outpatient Medications  Medication Sig Dispense Refill  . cephALEXin (KEFLEX) 250 MG capsule Take 250 mg by mouth daily.    . predniSONE (DELTASONE) 10 MG tablet TAKE 1 TABLET (10 MG TOTAL) BY MOUTH DAILY WITH BREAKFAST. 30 tablet 1   No current facility-administered medications for this visit.    REVIEW OF SYSTEMS:   Review of Systems  Constitutional: Negative for appetite change, chills, diaphoresis, fatigue and fever.  Cardiovascular: Positive for palpitations.  Gastrointestinal: Negative for abdominal pain.  Genitourinary: Positive for difficulty urinating (hx of UTI's).   Psychiatric/Behavioral: Positive for depression and sleep disturbance. The patient is nervous/anxious.      PHYSICAL EXAMINATION: ECOG PERFORMANCE STATUS: 0 - Asymptomatic  Vitals:   04/30/20 1547  BP: (!) 148/85  Pulse: 94  Resp: 18  Temp: (!) 96.8 F (36 C)  SpO2: 98%   Filed Weights   04/30/20 1547  Weight: 152 lb 12.8 oz (69.3 kg)   Physical Exam Vitals reviewed.  Constitutional:      Appearance: Normal appearance. She is obese.  Cardiovascular:     Rate and Rhythm: Normal rate and regular rhythm.     Pulses: Normal pulses.     Heart sounds: Normal heart sounds.  Pulmonary:     Effort: Pulmonary effort is normal.     Breath sounds: Normal breath sounds.  Abdominal:     Palpations: Abdomen is soft. There is no hepatomegaly, splenomegaly or mass.     Tenderness: There is no abdominal tenderness.     Hernia: No hernia is present.  Musculoskeletal:     Right lower leg: No edema.     Left lower leg: No edema.  Lymphadenopathy:     Upper Body:     Right upper body: No axillary adenopathy.     Left upper body: No axillary adenopathy.  Neurological:     General: No focal deficit present.      Mental Status: She is alert and oriented to person, place, and time.  Psychiatric:        Mood and Affect: Mood normal.        Behavior: Behavior normal.     LABORATORY DATA:  I have reviewed the data as listed CBC Latest Ref Rng & Units 04/21/2020 12/06/2019  WBC 3.4 - 10.8 x10E3/uL 9.8 9.4  Hemoglobin 11.1 - 15.9 g/dL 14.6 13.1  Hematocrit 34.0 - 46.6 % 43.9 39.3  Platelets 150 - 450 x10E3/uL 307 231   CMP Latest Ref Rng & Units 04/21/2020 12/06/2019 10/12/2019  Glucose 65 - 99 mg/dL 120(H) 69 -  BUN 8 - 27 mg/dL 17 23 -  Creatinine 0.57 - 1.00 mg/dL 0.85 0.75 0.70  Sodium 134 - 144 mmol/L 141 141 -  Potassium 3.5 - 5.2 mmol/L 4.2 3.9 -  Chloride 96 - 106 mmol/L 102 103 -  CO2 20 - 29 mmol/L 25 23 -  Calcium 8.7 - 10.3 mg/dL 10.4(H) 9.6 -  Total Protein 6.0 - 8.5 g/dL 7.0 7.8 -  Total Bilirubin 0.0 - 1.2 mg/dL 1.4(H) 1.2 -  Alkaline Phos 48 - 121 IU/L 60 81 -  AST 0 - 40 IU/L 29 35 -  ALT 0 - 32 IU/L 23 38(H) -   Lab Results  Component Value Date   VD25OH 31.3 12/06/2019    RADIOGRAPHIC STUDIES: I have personally reviewed the radiological images as listed and agreed with the findings in the report. MR Abdomen W Wo Contrast  Result Date: 04/22/2020 CLINICAL DATA:  Liver lesion on chest CT. EXAM: MRI ABDOMEN WITHOUT AND WITH CONTRAST TECHNIQUE: Multiplanar multisequence MR imaging of the abdomen was performed both before and after the administration of intravenous contrast. CONTRAST:  55mL GADAVIST GADOBUTROL 1 MMOL/ML IV SOLN COMPARISON:  Chest CT 04/09/2020. [facility] abdominopelvic CT of 03/02/2018. FINDINGS: Lower chest: Normal heart size without pericardial or pleural effusion. Moderate hiatal hernia. Hepatobiliary: No cirrhosis. Corresponding to the CT abnormality, centered within an atrophic segment 2 is heterogeneous T2 hyperintensity including on 11/07. On the order of 3.5 x 4.9 cm. Corresponds to T1 hypointensity on 16/16. Early post-contrast hyperenhancement may be  secondary to branch portal vein involvement, but is indeterminate. Definite delayed hyperenhancement, including on 16/28. Possible intrahepatic biliary duct dilatation within the involved segment 2. Example 13/23. The central most portion of the left portal vein is patent. Cholecystectomy, without biliary ductal dilatation. Pancreas:  Normal, without mass or ductal dilatation. Spleen:  Normal in size, without focal abnormality. Adrenals/Urinary Tract: Normal adrenal glands. Bilateral too small to characterize renal lesions versus or water Stomach/Bowel: Normal abdominal bowel loops. Vascular/Lymphatic: Normal aortic caliber. No abdominal adenopathy. Other:  No ascites.  No peritoneal metastasis. Musculoskeletal: No acute osseous abnormality. IMPRESSION: 1. Since 03/02/2018, development of lateral segment left liver lobe atrophy, abnormal signal and enhancement with possible intrahepatic duct dilatation. 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 An atypical appearance of hepatocellular carcinoma or metastatic disease felt less likely. Consider sampling. 2. Hiatal hernia. 3. No abdominal adenopathy. Electronically Signed   By: Abigail Miyamoto M.D.   On: 04/22/2020 18:11   CT Chest High Resolution  Result Date: 04/09/2020 CLINICAL DATA:  Chronic cough, history of multiple posterior. EXAM: CT CHEST WITHOUT CONTRAST TECHNIQUE: Multidetector CT imaging of the chest was performed following the standard protocol without intravenous contrast. High resolution imaging of the lungs, as well as inspiratory and expiratory imaging, was performed. COMPARISON:  10/12/2019, 07/12/2019. FINDINGS: Cardiovascular: Heart size is at the upper limits of normal to mildly enlarged. No pericardial effusion. Mediastinum/Nodes: No pathologically enlarged mediastinal or axillary lymph nodes. Hilar regions are difficult to definitively evaluate without IV contrast but appear grossly unremarkable. Esophagus is grossly unremarkable. Large  hiatal hernia. Lungs/Pleura: Interval clearing of previously seen peribronchovascular ground-glass. Negative for subpleural reticulation, traction bronchiectasis/bronchiolectasis, ground-glass, architectural distortion or honeycombing. Peripheral pulmonary nodules measure up to 2 mm, unchanged and likely benign. No pleural fluid. Airway is unremarkable. Minimal air trapping. Upper Abdomen: There is a new area of masslike low-attenuation in the left hepatic lobe measuring approximately 2.8 x 5.0 cm (4/103). New atrophy and marginal irregularity involving the left hepatic lobe. Cholecystectomy. Adrenal glands and visualized portions of  the kidneys, spleen, pancreas, stomach and bowel are grossly unremarkable with the exception a large hiatal hernia. Musculoskeletal: No worrisome lytic or sclerotic lesions. IMPRESSION: 1. Interval clearing of previously seen peribronchovascular ground-glass, indicative of a resolved infectious or inflammatory process. 2. No evidence of interstitial lung disease. 3. New area of masslike low-attenuation in the left hepatic lobe with left hepatic lobe atrophy. Further evaluation with MR abdomen without and with contrast is recommended as malignancy cannot be excluded. These results will be called to the ordering clinician or representative by the Radiologist Assistant, and communication documented in the PACS or [employer]. 4. 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 5. Large hiatal hernia. Electronically Signed   By: Lorin Picket M.D.   On: 04/09/2020 13:12    ASSESSMENT:  1.  Mass of the left lobe of the liver: -CT scan of the chest on 04/09/2020 showed mass in the left hepatic lobe with atrophy of the same lobe. -MRI of the abdomen on 04/22/2020 showed a new mass in the left liver lobe since July 2019, abnormal signal and enhancement with possible intrahepatic duct dilatation, suspicious for cholangiocarcinoma.  No abdominal adenopathy. -She denies any  fevers, night sweats or weight loss. -LFTs on 04/21/2020 shows mildly elevated total bilirubin of 1.4.  2.  Social/family history: -Works as a Print production planner.  Never smoker. -Mother had ovarian cancer in her 28s and father had prostate cancer.  Paternal grandfather had cirrhosis.  PLAN:  1.  Mass of the left lobe of the liver: -I have reviewed images with the patient and her husband. -I have recommended biopsy of the liver lesion by IR. -We will also order CA 19-9, CEA, AFP and a PET CT scan. -We will see her back after the biopsy.  2.  Mild hypercalcemia: -Calcium was 10.4 on 04/21/2020. -We will check intact PTH level.  All questions were answered. The patient knows to call the clinic with any problems, questions or concerns.  Derek Jack, MD, 04/30/20 4:52 PM  [place] [phone]   I, Milinda Antis, am acting as a scribe for Dr. Sanda Linger.  I, Derek Jack MD, have reviewed the above documentation for accuracy and completeness, and I agree with the above.

## 2020-04-30 NOTE — Progress Notes (Signed)
I have placed an introductory phone call to this patient. I introduced myself and explained my role in the patient's care. The patient reports no expected barriers to care. I provided my contact information and will plan to meet with the patient today during her initial visit with Dr. Delton Coombes.

## 2020-04-30 NOTE — Patient Instructions (Signed)
[place] at [facility] Discharge Instructions  You were seen and examined today by Dr. Delton Coombes. Dr. Delton Coombes is a medical oncologist, meaning he specializes in the medical management of cancer. Dr. Delton Coombes discussed your past medical history, family history of cancer and current functional status.   You were referred to the cancer center due to an area of concern identified on your liver on your chest CT and abdominal MRI. It is important to identify what the area is. Dr. Delton Coombes will arrange for a PET scan - which is similar to a CT scan that illuminates cancer in addition to lab work and a biopsy.  You will receive a phone call to notify you of these appointments.   Thank you for choosing [place] at [facility] to provide your oncology and hematology care.  To afford each patient quality time with our provider, please arrive at least 15 minutes before your scheduled appointment time.   If you have a lab appointment with the [facility] please come in thru the Main Entrance and check in at the main information desk.  You need to re-schedule your appointment should you arrive 10 or more minutes late.  We strive to give you quality time with our providers, and arriving late affects you and other patients whose appointments are after yours.  Also, if you no show three or more times for appointments you may be dismissed from the clinic at the providers discretion.     Again, thank you for choosing [facility].  Our hope is that these requests will decrease the amount of time that you wait before being seen by our physicians.       _____________________________________________________________  Should you have questions after your visit to [facility], please contact our office at [phone] and follow the prompts.  Our office hours are 8:00 a.m. and 4:30 p.m. Monday - Friday.  Please note that voicemails left  after 4:00 p.m. may not be returned until the following business day.  We are closed weekends and major holidays.  You do have access to a nurse 24-7, just call the main number to the clinic [phone] and do not press any options, hold on the line and a nurse will answer the phone.    For prescription refill requests, have your pharmacy contact our office and allow 72 hours.    Due to Covid, you will need to wear a mask upon entering the hospital. If you do not have a mask, a mask will be given to you at the Main Entrance upon arrival. For doctor visits, patients may have 1 support person age 5 or older with them. For treatment visits, patients can not have anyone with them due to social distancing guidelines and our immunocompromised population.

## 2020-05-01 ENCOUNTER — Encounter (HOSPITAL_COMMUNITY): Payer: Self-pay | Admitting: Radiology

## 2020-05-01 ENCOUNTER — Other Ambulatory Visit (HOSPITAL_COMMUNITY): Payer: Self-pay

## 2020-05-01 ENCOUNTER — Encounter: Payer: Self-pay | Admitting: Internal Medicine

## 2020-05-01 ENCOUNTER — Ambulatory Visit (INDEPENDENT_AMBULATORY_CARE_PROVIDER_SITE_OTHER): Payer: BC Managed Care – PPO | Admitting: Internal Medicine

## 2020-05-01 VITALS — BP 118/76 | HR 91 | Temp 97.2°F | Ht 59.0 in | Wt 151.4 lb

## 2020-05-01 DIAGNOSIS — K219 Gastro-esophageal reflux disease without esophagitis: Secondary | ICD-10-CM

## 2020-05-01 DIAGNOSIS — Z77120 Contact with and (suspected) exposure to mold (toxic): Secondary | ICD-10-CM | POA: Diagnosis not present

## 2020-05-01 DIAGNOSIS — Z5181 Encounter for therapeutic drug level monitoring: Secondary | ICD-10-CM | POA: Diagnosis not present

## 2020-05-01 DIAGNOSIS — R768 Other specified abnormal immunological findings in serum: Secondary | ICD-10-CM

## 2020-05-01 DIAGNOSIS — J849 Interstitial pulmonary disease, unspecified: Secondary | ICD-10-CM

## 2020-05-01 DIAGNOSIS — R160 Hepatomegaly, not elsewhere classified: Secondary | ICD-10-CM

## 2020-05-01 DIAGNOSIS — J679 Hypersensitivity pneumonitis due to unspecified organic dust: Secondary | ICD-10-CM | POA: Diagnosis not present

## 2020-05-01 DIAGNOSIS — Z7189 Other specified counseling: Secondary | ICD-10-CM

## 2020-05-01 DIAGNOSIS — Z7185 Encounter for immunization safety counseling: Secondary | ICD-10-CM

## 2020-05-01 DIAGNOSIS — K449 Diaphragmatic hernia without obstruction or gangrene: Secondary | ICD-10-CM

## 2020-05-01 DIAGNOSIS — T378X5A Adverse effect of other specified systemic anti-infectives and antiparasitics, initial encounter: Secondary | ICD-10-CM

## 2020-05-01 MED ORDER — PREDNISONE 1 MG PO TABS
1.0000 mg | ORAL_TABLET | Freq: Every day | ORAL | 0 refills | Status: DC
Start: 2020-05-01 — End: 2020-09-04

## 2020-05-01 NOTE — Progress Notes (Signed)
[facility] Female, 64 y.o., Apr 23, 1956 MRN:  [id number] Phone:  [phone] Jerilynn Mages) PCP:  Chevis Pretty, FNP Coverage:  Sherre Poot Blue [employer] Ppo Next Appt With Radiology ([facility] PET) 05/08/2020 at 11:00 AM  RE: CT Biopsy Received: Today Corrie Mckusick, DO  Garth Bigness D OK for US guided liver mass biopsy attempt.   Left lobe, possible ICC on MR.    Earleen Newport       Previous Messages   ----- Message -----  From: Garth Bigness D  Sent: 04/30/2020  5:52 PM EDT  To: Ir Procedure Requests  Subject: CT Biopsy                     Procedure:  CT Biopsy   Reason:  Liver mass, left lobe   History: MR in computer, NM PET to be scheduled   Provider: Derek Jack   Provider Contact: [phone]

## 2020-05-01 NOTE — Patient Instructions (Addendum)
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  This adds to the belief that he did indeed have hypersensitive pneumonitis either due to nitrofurantoin and or mold in the church  ppLAN  -Time to slowly stop prednisone: Take 5 mg once daily for 2 weeks and then 5 mg Monday Wednesday and Friday for 2 weeks and then 2 mg Monday Wednesday Friday for 2 weeks and then 1 mg Monday Wednesday Friday for 2 weeks and stop  -Definitely avoid all mold exposure [glad the church is cleaned and you are tolerating visiting the church without any problems]   Hiatal hernia with GERD  -This can be a long-term risk factor for developing pulmonary inflammation  Plan -Work with your primary care physician over time to ensure no acid reflux -Can always consider Prilosec  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  In retrospect this might be false positive because of nitrofurantoin  Plan -We will clinically follow-up.  No need for rheumatology follow-up  Liver mass, left lobe  -Best wishes for upcoming biopsy  Vaccine counseling  -Recommend high-dose flu shot and Covid booster vaccine -Also recommend Shingrix shingles vaccine -Recommend he had these vaccines after your biopsy of the liver [timing coordination with primary care physician]  Follow-up -12 weeks do spirometry and DLCO -Return to clinic to see Dr. Chase Caller in 12 weeks 30-minute visit  Call
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
## 2020-05-05 ENCOUNTER — Inpatient Hospital Stay (HOSPITAL_COMMUNITY): Payer: BC Managed Care – PPO

## 2020-05-05 ENCOUNTER — Other Ambulatory Visit: Payer: Self-pay

## 2020-05-05 DIAGNOSIS — K769 Liver disease, unspecified: Secondary | ICD-10-CM | POA: Diagnosis not present

## 2020-05-05 DIAGNOSIS — R160 Hepatomegaly, not elsewhere classified: Secondary | ICD-10-CM

## 2020-05-06 LAB — AFP TUMOR MARKER: AFP, Serum, Tumor Marker: 1.9 ng/mL (ref 0.0–8.3)

## 2020-05-06 LAB — CANCER ANTIGEN 19-9: CA 19-9: 88 U/mL — ABNORMAL HIGH (ref 0–35)

## 2020-05-06 LAB — PTH, INTACT AND CALCIUM
Calcium, Total (PTH): 9.8 mg/dL (ref 8.7–10.3)
PTH: 21 pg/mL (ref 15–65)

## 2020-05-06 LAB — CEA: CEA: 2 ng/mL (ref 0.0–4.7)

## 2020-05-08 ENCOUNTER — Other Ambulatory Visit: Payer: Self-pay

## 2020-05-08 ENCOUNTER — Ambulatory Visit (HOSPITAL_COMMUNITY)
Admission: RE | Admit: 2020-05-08 | Discharge: 2020-05-08 | Disposition: A | Discharge status: Home / Self Care | Payer: BC Managed Care – PPO | Source: Ambulatory Visit | Attending: Hematology | Admitting: Hematology

## 2020-05-08 ENCOUNTER — Other Ambulatory Visit: Payer: Self-pay | Admitting: Radiology

## 2020-05-08 DIAGNOSIS — R160 Hepatomegaly, not elsewhere classified: Secondary | ICD-10-CM | POA: Diagnosis not present

## 2020-05-08 LAB — GLUCOSE, CAPILLARY: Glucose-Capillary: 78 mg/dL (ref 70–99)

## 2020-05-08 MED ORDER — FLUDEOXYGLUCOSE F - 18 (FDG) INJECTION
7.4000 | Freq: Once | INTRAVENOUS | Status: AC
  Administered 2020-05-08: 7.4 via INTRAVENOUS

## 2020-05-09 ENCOUNTER — Encounter (HOSPITAL_COMMUNITY): Payer: Self-pay

## 2020-05-09 ENCOUNTER — Ambulatory Visit (HOSPITAL_COMMUNITY)
Admission: RE | Admit: 2020-05-09 | Discharge: 2020-05-09 | Disposition: A | Discharge status: Home / Self Care | Payer: BC Managed Care – PPO | Source: Ambulatory Visit | Attending: Hematology | Admitting: Hematology

## 2020-05-09 ENCOUNTER — Other Ambulatory Visit (HOSPITAL_COMMUNITY): Payer: Self-pay | Admitting: Hematology

## 2020-05-09 DIAGNOSIS — R160 Hepatomegaly, not elsewhere classified: Secondary | ICD-10-CM

## 2020-05-09 LAB — CBC
HCT: 43.5 % (ref 36.0–46.0)
Hemoglobin: 14.6 g/dL (ref 12.0–15.0)
MCH: 33.3 pg (ref 26.0–34.0)
MCHC: 33.6 g/dL (ref 30.0–36.0)
MCV: 99.3 fL (ref 80.0–100.0)
Platelets: 266 10*3/uL (ref 150–400)
RBC: 4.38 MIL/uL (ref 3.87–5.11)
RDW: 12.4 % (ref 11.5–15.5)
WBC: 10 10*3/uL (ref 4.0–10.5)
nRBC: 0 % (ref 0.0–0.2)

## 2020-05-09 LAB — PROTIME-INR
INR: 0.9 (ref 0.8–1.2)
Prothrombin Time: 12 seconds (ref 11.4–15.2)

## 2020-05-09 MED ORDER — MIDAZOLAM HCL 2 MG/2ML IJ SOLN
INTRAMUSCULAR | Status: AC | PRN
  Administered 2020-05-09 (×2): 1 mg via INTRAVENOUS

## 2020-05-09 MED ORDER — GELATIN ABSORBABLE 12-7 MM EX MISC
CUTANEOUS | Status: AC
  Filled 2020-05-09: qty 1

## 2020-05-09 MED ORDER — FENTANYL CITRATE (PF) 100 MCG/2ML IJ SOLN
INTRAMUSCULAR | Status: AC | PRN
  Administered 2020-05-09 (×2): 50 ug via INTRAVENOUS

## 2020-05-09 MED ORDER — SODIUM CHLORIDE 0.9 % IV SOLN: INTRAVENOUS | Status: DC

## 2020-05-09 MED ORDER — MIDAZOLAM HCL 2 MG/2ML IJ SOLN
INTRAMUSCULAR | Status: AC
  Filled 2020-05-09: qty 2

## 2020-05-09 MED ORDER — FENTANYL CITRATE (PF) 100 MCG/2ML IJ SOLN
INTRAMUSCULAR | Status: AC
  Filled 2020-05-09: qty 2

## 2020-05-09 MED ORDER — LIDOCAINE HCL 1 % IJ SOLN
INTRAMUSCULAR | Status: AC
  Filled 2020-05-09: qty 20

## 2020-05-09 NOTE — Consult Note (Signed)
Chief Complaint: Patient was seen in consultation today for image guided liver lesion biopsy   Referring Physician(s): [place]  Supervising Physician: Suttle,D  Patient Status: [place]  History of Present Illness: Amber Winters is a 64 y.o. female non-smoker with history of hyperlipidemia, interstitial lung disease, prior cholecystectomy who was incidentally found to have a new area of masslike low-attenuation in the left hepatic lobe with left hepatic lobe atrophy concerning for malignancy on recent CT chest.  Subsequent MRI of the abdomen on 8/31 revealed development of lateral segment left liver lobe atrophy with abnormal signal and enhancement with possible intrahepatic duct dilatation concerning for cholangiocarcinoma.  PET scan performed yesterday revealed no hypermetabolic activity in the left hepatic lobe. Patient's  CA 19-9 level was 88, CEA normal, AFP normal. She  has no known history of cancer.  She presents today for image guided liver lesion biopsy for further evaluation.   Past Medical History:  Diagnosis Date  . Hyperlipidemia     Past Surgical History:  Procedure Laterality Date  . ABDOMINAL HYSTERECTOMY  2000  . BRONCHIAL WASHINGS  11/12/2019   Procedure: BRONCHIAL WASHINGS;  Surgeon: Brand Males, MD;  Location: [facility];  Service: Endoscopy;;  . CATARACT EXTRACTION, BILATERAL    . CHOLECYSTECTOMY  2001  . HERNIA REPAIR    . URETHRAL SLING    . VIDEO BRONCHOSCOPY  11/12/2019   Procedure: VIDEO BRONCHOSCOPY WITH FLUORO;  Surgeon: Brand Males, MD;  Location: [facility];  Service: Endoscopy;;    Allergies: Ciprofloxacin, Nitrofurantoin, Asa [aspirin], Sulfa antibiotics, and Zithromax [azithromycin]  Medications: Prior to Admission medications   Medication Sig Start Date End Date Taking? Authorizing Provider  cephALEXin (KEFLEX) 250 MG capsule Take 250 mg by mouth daily. 03/25/20  Yes [provider]  predniSONE  (DELTASONE) 1 MG tablet Take 1 tablet (1 mg total) by mouth daily with breakfast. -Time to slowly stop prednisone: Take 5 mg once daily for 2 weeks and then 5 mg Monday Wednesday and Friday for 2 weeks and then 2 mg Monday Wednesday Friday for 2 weeks and then 1 mg Monday Wednesday Friday for 2 weeks and stop 05/01/20  Yes Ramaswamy, Belva Crome, MD  predniSONE (DELTASONE) 10 MG tablet TAKE 1 TABLET (10 MG TOTAL) BY MOUTH DAILY WITH BREAKFAST. 04/07/20  Yes Brand Males, MD     Family History  Problem Relation Age of Onset  . Ovarian cancer Mother   . Anuerysm Father   . Hypertension Brother   . Healthy Daughter   . Healthy Daughter     Social History   Socioeconomic History  . Marital status: Married    Spouse name: Not on file  . Number of children: 2  . Years of education: Not on file  . Highest education level: Not on file  Occupational History  . Not on file  Tobacco Use  . Smoking status: Never Smoker  . Smokeless tobacco: Never Used  Vaping Use  . Vaping Use: Never used  Substance and Sexual Activity  . Alcohol use: No  . Drug use: No  . Sexual activity: Not on file  Other Topics Concern  . Not on file  Social History Narrative  . Not on file   Social Determinants of Health   Financial Resource Strain:   . Difficulty of Paying Living Expenses: Not on file  Food Insecurity:   . Worried About Charity fundraiser in the Last Year: Not on file  . Ran Out of Food  in the Last Year: Not on file  Transportation Needs:   . Lack of Transportation (Medical): Not on file  . Lack of Transportation (Non-Medical): Not on file  Physical Activity:   . Days of Exercise per Week: Not on file  . Minutes of Exercise per Session: Not on file  Stress:   . Feeling of Stress : Not on file  Social Connections:   . Frequency of Communication with Friends and Family: Not on file  . Frequency of Social Gatherings with Friends and Family: Not on file  . Attends Religious Services: Not on  file  . Active Member of Clubs or Organizations: Not on file  . Attends Archivist Meetings: Not on file  . Marital Status: Not on file      Review of Systems denies fever, headache, chest pain, dyspnea, abdominal/back pain, nausea, vomiting or bleeding.  She is anxious, does have occasional cough and sinus drainage.  Vital Signs: BP (!) 174/103   Pulse 75   Temp 98.2 F (36.8 C) (Oral)   Resp 16   SpO2 98%   Physical Exam awake, alert.  Chest clear to auscultation bilaterally.  Heart with regular rate and rhythm.  Abdomen soft, positive bowel sounds, nontender.  Some trace pretibial edema bilaterally.  Imaging: MR Abdomen W Wo Contrast  Result Date: 04/22/2020 CLINICAL DATA:  Liver lesion on chest CT. EXAM: MRI ABDOMEN WITHOUT AND WITH CONTRAST TECHNIQUE: Multiplanar multisequence MR imaging of the abdomen was performed both before and after the administration of intravenous contrast. CONTRAST:  90mL GADAVIST GADOBUTROL 1 MMOL/ML IV SOLN COMPARISON:  Chest CT 04/09/2020. [facility] abdominopelvic CT of 03/02/2018. FINDINGS: Lower chest: Normal heart size without pericardial or pleural effusion. Moderate hiatal hernia. Hepatobiliary: No cirrhosis. Corresponding to the CT abnormality, centered within an atrophic segment 2 is heterogeneous T2 hyperintensity including on 11/07. On the order of 3.5 x 4.9 cm. Corresponds to T1 hypointensity on 16/16. Early post-contrast hyperenhancement may be secondary to branch portal vein involvement, but is indeterminate. Definite delayed hyperenhancement, including on 16/28. Possible intrahepatic biliary duct dilatation within the involved segment 2. Example 13/23. The central most portion of the left portal vein is patent. Cholecystectomy, without biliary ductal dilatation. Pancreas:  Normal, without mass or ductal dilatation. Spleen:  Normal in size, without focal abnormality. Adrenals/Urinary Tract: Normal adrenal glands. Bilateral too small  to characterize renal lesions versus or water Stomach/Bowel: Normal abdominal bowel loops. Vascular/Lymphatic: Normal aortic caliber. No abdominal adenopathy. Other:  No ascites.  No peritoneal metastasis. Musculoskeletal: No acute osseous abnormality. IMPRESSION: 1. Since 03/02/2018, development of lateral segment left liver lobe atrophy, abnormal signal and enhancement with possible intrahepatic duct dilatation. 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 An atypical appearance of hepatocellular carcinoma or metastatic disease felt less likely. Consider sampling. 2. Hiatal hernia. 3. No abdominal adenopathy. Electronically Signed   By: Abigail Miyamoto M.D.   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 EXAM: NUCLEAR MEDICINE PET SKULL BASE TO THIGH TECHNIQUE: 7.4 mCi F-18 FDG was injected intravenously. Full-ring PET imaging was performed from the skull base to thigh after the radiotracer. CT data was obtained and used for attenuation correction and anatomic localization. Fasting blood glucose: 78 mg/dl COMPARISON:  Abdominal MRI 04/22/2020 FINDINGS: Mediastinal blood pool activity: SUV max 2.3 Liver activity: SUV max NA NECK: No hypermetabolic lymph nodes in the neck. Incidental CT  findings: none CHEST: No hypermetabolic mediastinal or hilar nodes. No suspicious pulmonary nodules on the CT scan. Incidental CT findings: Large hiatal hernia with 3/4 the stomach in the posterior middle mediastinum. ABDOMEN/PELVIS: no abnormal metabolic activity in the LEFT hepatic lobe. Atrophy of the LEFT hepatic lobe is again demonstrated. No discrete focal hepatic lesions above background. No hypermetabolic abdominopelvic lymph nodes. Incidental CT findings: Post hysterectomy.  Adnexa unremarkable SKELETON: No focal hypermetabolic activity to suggest skeletal metastasis. Incidental CT findings: none  IMPRESSION: 1. No hypermetabolic activity in the LEFT hepatic lobe to localize a primary liver or bile duct carcinoma. Of note cholangiocarcinoma and metastatic carcinoma can have low metabolic activity. However, no PET-CT evidence to suggest either of these malignancies. 2. Large hiatal hernia. Electronically Signed   By: Suzy Bouchard M.D.   On: 05/08/2020 16:11    Labs:  CBC: Recent Labs    12/06/19 0800 04/21/20 1651  WBC 9.4 9.8  HGB 13.1 14.6  HCT 39.3 43.9  PLT 231 307    COAGS: No results for input(s): INR, APTT in the last 8760 hours.  BMP: Recent Labs    06/14/19 1003 06/14/19 1003 07/11/19 1010 10/12/19 1048 12/06/19 0800 04/21/20 1651 05/05/20 1259  NA 139  --  137  --  141 141  --   K 4.8  --  4.2  --  3.9 4.2  --   CL 103  --  102  --  103 102  --   CO2 25  --  22  --  23 25  --   GLUCOSE 86  --  73  --  69 120*  --   BUN 16  --  16  --  23 17  --   CALCIUM 9.4   < > 9.6  --  9.6 10.4* 9.8  CREATININE 0.88   < > 0.77 0.70 0.75 0.85  --   GFRNONAA 71  --  83  --  85 73  --   GFRAA 81  --  96  --  98 84  --    < > = values in this interval not displayed.    LIVER FUNCTION TESTS: Recent Labs    06/14/19 1003 07/11/19 1010 12/06/19 0800 04/21/20 1651  BILITOT 1.1 1.1 1.2 1.4*  AST 103* 137* 35 29  ALT 109* 140* 38* 23  ALKPHOS 94 106 81 60  PROT 7.2 7.5 7.8 7.0  ALBUMIN 4.2 4.2 4.1 4.5    TUMOR MARKERS: No results for input(s): AFPTM, CEA, CA199, CHROMGRNA in the last 8760 hours.  Assessment and Plan: 64 y.o. female non-smoker with history of hyperlipidemia, interstitial lung disease, prior cholecystectomy who was incidentally found to have a new area of masslike low-attenuation in the left hepatic lobe with left hepatic lobe atrophy concerning for malignancy on recent CT chest.  Subsequent MRI of the abdomen on 8/31 revealed development of lateral segment left liver lobe atrophy with abnormal signal and enhancement with possible intrahepatic  duct dilatation concerning for cholangiocarcinoma.  PET scan performed yesterday revealed no hypermetabolic activity in the left hepatic lobe. Patient's  CA 19-9 level was 88, CEA normal, AFP normal. She  has no known history of cancer.  She presents today for image guided liver lesion biopsy for further evaluation.Risks and benefits of procedure was discussed with the patient  including, but not limited to bleeding, infection, damage to adjacent structures or low yield requiring additional tests.  All of the questions were answered and  there is agreement to proceed.  Consent signed and in chart.     Thank you for this interesting consult.  I greatly enjoyed meeting Amber Winters and look forward to participating in their care.  A copy of this report was sent to the requesting provider on this date.  Electronically Signed: D. Rowe Robert, PA-C 05/09/2020, 11:57 AM   I spent a total of  25 minutes in face to face in clinical consultation, greater than 50% of which was counseling/coordinating care for image guided liver lesion biopsy

## 2020-05-09 NOTE — Progress Notes (Signed)
Amber Winters presented for left lobe liver biopsy today.  On ultrasound exam, the left lobe is invisible, guarded by xiphoid process and transverse colon.  No safe window for percutaneous biopsy is present.  Unfortunately, the planned biopsy had to be canceled.  Reviewing prior imaging, there is no safe window to biopsy the atrophic left lobe via other percutaneous methods (i.e. CT guided).  This was explained to the patient and she stated understanding.  Ruthann Cancer, MD

## 2020-05-09 NOTE — Sedation Documentation (Signed)
Procedure cancelled by Dr. Serafina Royals. Patient prepared for transfer to Short stay for recovery.

## 2020-05-09 NOTE — Discharge Instructions (Addendum)
Moderate Conscious Sedation, Adult, Care After These instructions provide you with information about caring for yourself after your procedure. Your health care provider may also give you more specific instructions. Your treatment has been planned according to current medical practices, but problems sometimes occur. Call your health care provider if you have any problems or questions after your procedure. What can I expect after the procedure? After your procedure, it is common:  To feel sleepy for several hours.  To feel clumsy and have poor balance for several hours.  To have poor judgment for several hours.  To vomit if you eat too soon. Follow these instructions at home: For at least 24 hours after the procedure:   Do not: ? Participate in activities where you could fall or become injured. ? Drive. ? Use heavy machinery. ? Drink alcohol. ? Take sleeping pills or medicines that cause drowsiness. ? Make important decisions or sign legal documents. ? Take care of children on your own.  Rest. Eating and drinking  Follow the diet recommended by your health care provider.  If you vomit: ? Drink water, juice, or soup when you can drink without vomiting. ? Make sure you have little or no nausea before eating solid foods. General instructions  Have a responsible adult stay with you until you are awake and alert.  Take over-the-counter and prescription medicines only as told by your health care provider.  If you smoke, do not smoke without supervision.  Keep all follow-up visits as told by your health care provider. This is important. Contact a health care provider if:  You keep feeling nauseous or you keep vomiting.  You feel light-headed.  You develop a rash.  You have a fever. Get help right away if:  You have trouble breathing. This information is not intended to replace advice given to you by your health care provider. Make sure you discuss any questions you have  with your health care provider. Document Revised: 07/22/2017 Document Reviewed: 11/29/2015 Elsevier Patient Education  2020 [employer]  

## 2020-05-15 ENCOUNTER — Ambulatory Visit (HOSPITAL_COMMUNITY): Payer: BC Managed Care – PPO | Admitting: Hematology

## 2020-05-16 ENCOUNTER — Ambulatory Visit: Payer: BC Managed Care – PPO | Admitting: Nurse Practitioner

## 2020-05-19 ENCOUNTER — Ambulatory Visit (HOSPITAL_COMMUNITY): Payer: BC Managed Care – PPO | Admitting: Hematology

## 2020-05-21 ENCOUNTER — Other Ambulatory Visit (HOSPITAL_COMMUNITY): Payer: Self-pay | Admitting: *Deleted

## 2020-05-21 ENCOUNTER — Other Ambulatory Visit: Payer: Self-pay

## 2020-05-21 DIAGNOSIS — R160 Hepatomegaly, not elsewhere classified: Secondary | ICD-10-CM

## 2020-05-22 ENCOUNTER — Ambulatory Visit (HOSPITAL_COMMUNITY): Payer: BC Managed Care – PPO | Admitting: Hematology

## 2020-05-28 ENCOUNTER — Ambulatory Visit (HOSPITAL_COMMUNITY): Admission: RE | Admit: 2020-05-28 | Payer: BC Managed Care – PPO | Source: Ambulatory Visit

## 2020-05-29 ENCOUNTER — Ambulatory Visit (HOSPITAL_COMMUNITY): Payer: BC Managed Care – PPO | Admitting: Hematology

## 2020-05-30 ENCOUNTER — Other Ambulatory Visit: Payer: Self-pay

## 2020-05-30 ENCOUNTER — Ambulatory Visit (HOSPITAL_COMMUNITY)
Admission: RE | Admit: 2020-05-30 | Discharge: 2020-05-30 | Disposition: A | Discharge status: Home / Self Care | Payer: BC Managed Care – PPO | Source: Ambulatory Visit | Attending: Hematology | Admitting: Hematology

## 2020-05-30 ENCOUNTER — Other Ambulatory Visit (HOSPITAL_COMMUNITY): Payer: Self-pay | Admitting: Hematology

## 2020-05-30 DIAGNOSIS — R160 Hepatomegaly, not elsewhere classified: Secondary | ICD-10-CM | POA: Diagnosis present

## 2020-05-30 MED ORDER — GADOBUTROL 1 MMOL/ML IV SOLN
7.0000 mL | Freq: Once | INTRAVENOUS | Status: AC | PRN
  Administered 2020-05-30: 7 mL via INTRAVENOUS

## 2020-05-31 ENCOUNTER — Ambulatory Visit (HOSPITAL_COMMUNITY): Payer: BC Managed Care – PPO

## 2020-06-03 ENCOUNTER — Encounter (HOSPITAL_COMMUNITY): Payer: Self-pay | Admitting: Hematology

## 2020-06-03 ENCOUNTER — Inpatient Hospital Stay (HOSPITAL_COMMUNITY): Payer: BC Managed Care – PPO | Attending: Hematology | Admitting: Hematology

## 2020-06-03 VITALS — BP 158/87 | HR 91 | Temp 97.7°F | Resp 18 | Wt 155.1 lb

## 2020-06-03 DIAGNOSIS — K769 Liver disease, unspecified: Secondary | ICD-10-CM | POA: Diagnosis not present

## 2020-06-03 DIAGNOSIS — Z23 Encounter for immunization: Secondary | ICD-10-CM | POA: Diagnosis not present

## 2020-06-03 DIAGNOSIS — E785 Hyperlipidemia, unspecified: Secondary | ICD-10-CM | POA: Diagnosis not present

## 2020-06-03 DIAGNOSIS — Z90710 Acquired absence of both cervix and uterus: Secondary | ICD-10-CM | POA: Diagnosis not present

## 2020-06-03 DIAGNOSIS — Z8041 Family history of malignant neoplasm of ovary: Secondary | ICD-10-CM | POA: Diagnosis not present

## 2020-06-03 DIAGNOSIS — Z7952 Long term (current) use of systemic steroids: Secondary | ICD-10-CM | POA: Diagnosis not present

## 2020-06-03 DIAGNOSIS — Z8042 Family history of malignant neoplasm of prostate: Secondary | ICD-10-CM | POA: Insufficient documentation

## 2020-06-03 DIAGNOSIS — R160 Hepatomegaly, not elsewhere classified: Secondary | ICD-10-CM

## 2020-06-03 MED ORDER — INFLUENZA VAC SPLIT QUAD 0.5 ML IM SUSY
0.5000 mL | PREFILLED_SYRINGE | Freq: Once | INTRAMUSCULAR | Status: AC
  Administered 2020-06-03: 0.5 mL via INTRAMUSCULAR
  Filled 2020-06-03: qty 0.5

## 2020-06-03 NOTE — Patient Instructions (Signed)
[place] [facility] Discharge Instructions  You were seen today by Dr. Delton Coombes. He went over your recent results and scans; no masses were seen in or around your liver and the left lobe of your liver is atrophied. You will be referred to Dr. Allison Quarry in [place] for further imaging. Dr. Delton Coombes will see you back in 6-8 weeks for follow up.   Thank you for choosing [facility] to provide your oncology and hematology care.  To afford each patient quality time with our provider, please arrive at least 15 minutes before your scheduled appointment time.   If you have a lab appointment with the [facility] please come in thru the Main Entrance and check in at the main information desk  You need to re-schedule your appointment should you arrive 10 or more minutes late.  We strive to give you quality time with our providers, and arriving late affects you and other patients whose appointments are after yours.  Also, if you no show three or more times for appointments you may be dismissed from the clinic at the providers discretion.     Again, thank you for choosing [facility].  Our hope is that these requests will decrease the amount of time that you wait before being seen by our physicians.       _____________________________________________________________  Should you have questions after your visit to [facility], please contact our office at [phone] between the hours of 8:00 a.m. and 4:30 p.m.  Voicemails left after 4:00 p.m. will not be returned until the following business day.  For prescription refill requests, have your pharmacy contact our office and allow 72 hours.    Cancer Center Support Programs:   > Cancer Support Group  2nd Tuesday of the month 1pm-2pm, Journey Room

## 2020-06-03 NOTE — Progress Notes (Signed)
Patient tolerated flu vaccine with no complaints voiced.  Site clean and dry with no bruising or swelling noted at site.  Band aid applied.  VSS. 

## 2020-06-03 NOTE — Progress Notes (Signed)
Amber Winters, [place] [address]   CLINIC:  Medical Oncology/Hematology  PCP:  Chevis Pretty, [place] / [place] [address] [phone]   REASON FOR VISIT:  Follow-up for left liver lobe mass  PRIOR THERAPY: None  NGS Results: Not done  CURRENT THERAPY: Observation  BRIEF ONCOLOGIC HISTORY:  Oncology History   No history exists.    CANCER STAGING: Cancer Staging No matching staging information was found for the patient.  INTERVAL HISTORY:  [name], a 64 y.o. female, returns for routine follow-up of her left liver lobe mass. Amber Winters was last seen on 04/30/2020.  Today she is accompanied by her husband. She reports feeling well and denies having abdominal pain.   REVIEW OF SYSTEMS:  Review of Systems  Constitutional: Negative for appetite change and fatigue.  Cardiovascular: Positive for palpitations (when taking prednisone).  Gastrointestinal: Negative for abdominal pain.  Neurological: Positive for headaches.  Psychiatric/Behavioral: The patient is nervous/anxious.   All other systems reviewed and are negative.   PAST MEDICAL/SURGICAL HISTORY:  Past Medical History:  Diagnosis Date  . Hyperlipidemia    Past Surgical History:  Procedure Laterality Date  . ABDOMINAL HYSTERECTOMY  2000  . BRONCHIAL WASHINGS  11/12/2019   Procedure: BRONCHIAL WASHINGS;  Surgeon: Brand Males, MD;  Location: [facility];  Service: Endoscopy;;  . CATARACT EXTRACTION, BILATERAL    . CHOLECYSTECTOMY  2001  . HERNIA REPAIR    . URETHRAL SLING    . VIDEO BRONCHOSCOPY  11/12/2019   Procedure: VIDEO BRONCHOSCOPY WITH FLUORO;  Surgeon: Brand Males, MD;  Location: [facility];  Service: Endoscopy;;    SOCIAL HISTORY:  Social History   Socioeconomic History  . Marital status: Married    Spouse name: Not on file  . Number of children: 2  . Years of education: Not on file  . Highest education level: Not  on file  Occupational History  . Not on file  Tobacco Use  . Smoking status: Never Smoker  . Smokeless tobacco: Never Used  Vaping Use  . Vaping Use: Never used  Substance and Sexual Activity  . Alcohol use: No  . Drug use: No  . Sexual activity: Not on file  Other Topics Concern  . Not on file  Social History Narrative  . Not on file   Social Determinants of Health   Financial Resource Strain:   . Difficulty of Paying Living Expenses: Not on file  Food Insecurity:   . Worried About Charity fundraiser in the Last Year: Not on file  . Ran Out of Food in the Last Year: Not on file  Transportation Needs:   . Lack of Transportation (Medical): Not on file  . Lack of Transportation (Non-Medical): Not on file  Physical Activity:   . Days of Exercise per Week: Not on file  . Minutes of Exercise per Session: Not on file  Stress:   . Feeling of Stress : Not on file  Social Connections:   . Frequency of Communication with Friends and Family: Not on file  . Frequency of Social Gatherings with Friends and Family: Not on file  . Attends Religious Services: Not on file  . Active Member of Clubs or Organizations: Not on file  . Attends Archivist Meetings: Not on file  . Marital Status: Not on file  Intimate Partner Violence:   . Fear of Current or Ex-Partner: Not on file  . Emotionally  Abused: Not on file  . Physically Abused: Not on file  . Sexually Abused: Not on file    FAMILY HISTORY:  Family History  Problem Relation Age of Onset  . Ovarian cancer Mother   . Anuerysm Father   . Hypertension Brother   . Healthy Daughter   . Healthy Daughter     CURRENT MEDICATIONS:  Current Outpatient Medications  Medication Sig Dispense Refill  . cephALEXin (KEFLEX) 250 MG capsule Take 250 mg by mouth daily.    . predniSONE (DELTASONE) 1 MG tablet Take 1 tablet (1 mg total) by mouth daily with breakfast. -Time to slowly stop prednisone: Take 5 mg once daily for 2 weeks and  then 5 mg Monday Wednesday and Friday for 2 weeks and then 2 mg Monday Wednesday Friday for 2 weeks and then 1 mg Monday Wednesday Friday for 2 weeks and stop 142 tablet 0   No current facility-administered medications for this visit.    ALLERGIES:  Allergies  Allergen Reactions  . Ciprofloxacin Other (See Comments)    Patient states this causes severe muscle aching and discomfort.  . Nitrofurantoin Other (See Comments)    ILD  . Asa [Aspirin]     Tinnitis  . Sulfa Antibiotics Nausea And Vomiting  . Zithromax [Azithromycin]     Abdominal cramps    PHYSICAL EXAM:  Performance status (ECOG): 0 - Asymptomatic  Vitals:   06/03/20 1357  BP: (!) 158/87  Pulse: 91  Resp: 18  Temp: 97.7 F (36.5 C)  SpO2: 98%   Wt Readings from Last 3 Encounters:  06/03/20 155 lb 1.6 oz (70.4 kg)  05/01/20 151 lb 6.4 oz (68.7 kg)  04/30/20 152 lb 12.8 oz (69.3 kg)   Physical Exam Vitals reviewed.  Constitutional:      Appearance: Normal appearance. She is obese.  Abdominal:     Palpations: Abdomen is soft. There is no hepatomegaly or mass.     Tenderness: There is no abdominal tenderness.  Neurological:     General: No focal deficit present.     Mental Status: She is alert and oriented to person, place, and time.  Psychiatric:        Mood and Affect: Mood normal.        Behavior: Behavior normal.      LABORATORY DATA:  I have reviewed the labs as listed.  CBC Latest Ref Rng & Units 05/09/2020 04/21/2020 12/06/2019  WBC 4.0 - 10.5 K/uL 10.0 9.8 9.4  Hemoglobin 12.0 - 15.0 g/dL 14.6 14.6 13.1  Hematocrit 36 - 46 % 43.5 43.9 39.3  Platelets 150 - 400 K/uL 266 307 231   CMP Latest Ref Rng & Units 05/05/2020 04/21/2020 12/06/2019  Glucose 65 - 99 mg/dL - 120(H) 69  BUN 8 - 27 mg/dL - 17 23  Creatinine 0.57 - 1.00 mg/dL - 0.85 0.75  Sodium 134 - 144 mmol/L - 141 141  Potassium 3.5 - 5.2 mmol/L - 4.2 3.9  Chloride 96 - 106 mmol/L - 102 103  CO2 20 - 29 mmol/L - 25 23  Calcium 8.7 - 10.3  mg/dL 9.8 10.4(H) 9.6  Total Protein 6.0 - 8.5 g/dL - 7.0 7.8  Total Bilirubin 0.0 - 1.2 mg/dL - 1.4(H) 1.2  Alkaline Phos 48 - 121 IU/L - 60 81  AST 0 - 40 IU/L - 29 35  ALT 0 - 32 IU/L - 23 38(H)    DIAGNOSTIC IMAGING:  I have independently reviewed the scans and discussed with  the patient. MR 3D Recon At Scanner  Result Date: 06/01/2020 CLINICAL DATA:  Follow-up hepatic abnormality in left lobe. EXAM: MRI ABDOMEN WITHOUT AND WITH CONTRAST (INCLUDING MRCP) TECHNIQUE: Multiplanar multisequence MR imaging of the abdomen was performed both before and after the administration of intravenous contrast. Heavily T2-weighted images of the biliary and pancreatic ducts were obtained, and three-dimensional MRCP images were rendered by post processing. CONTRAST:  49mL GADAVIST GADOBUTROL 1 MMOL/ML IV SOLN COMPARISON:  MRI on 04/22/2020, and PET-CT on 05/08/2020 FINDINGS: Lower chest: No acute findings. Hepatobiliary: Diffuse atrophy of the left hepatic lobe is again seen, which shows diffuse T2 hyperintensity and hyperenhancement. Diffuse intrahepatic biliary ductal dilatation is seen within the atrophic left lobe, however no focal hepatic mass is visualized. Prior cholecystectomy noted. No evidence of dilatation involving the right intrahepatic bile ducts or common bile duct. No evidence of choledocholithiasis. Pancreas: No mass or inflammatory changes. No evidence of pancreatic ductal dilatation or pancreas divisum. Spleen:  Within normal limits in size and appearance. Adrenals/Urinary Tract: No masses identified. No evidence of hydronephrosis. Stomach/Bowel: Large hiatal hernia is again seen. Otherwise unremarkable. Vascular/Lymphatic: No pathologically enlarged lymph nodes identified. No abdominal aortic aneurysm. Other:  None. Musculoskeletal:  No suspicious bone lesions identified. IMPRESSION: Diffuse atrophy of the left hepatic lobe with intrahepatic biliary ductal dilatation. These findings remain stable,  and no focal hepatic mass identified. Consider ERCP for further evaluation. Large hiatal hernia. Electronically Signed   By: Marlaine Hind M.D.   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 EXAM: NUCLEAR MEDICINE PET SKULL BASE TO THIGH TECHNIQUE: 7.4 mCi F-18 FDG was injected intravenously. Full-ring PET imaging was performed from the skull base to thigh after the radiotracer. CT data was obtained and used for attenuation correction and anatomic localization. Fasting blood glucose: 78 mg/dl COMPARISON:  Abdominal MRI 04/22/2020 FINDINGS: Mediastinal blood pool activity: SUV max 2.3 Liver activity: SUV max NA NECK: No hypermetabolic lymph nodes in the neck. Incidental CT findings: none CHEST: No hypermetabolic mediastinal or hilar nodes. No suspicious pulmonary nodules on the CT scan. Incidental CT findings: Large hiatal hernia with 3/4 the stomach in the posterior middle mediastinum. ABDOMEN/PELVIS: no abnormal metabolic activity in the LEFT hepatic lobe. Atrophy of the LEFT hepatic lobe is again demonstrated. No discrete focal hepatic lesions above background. No hypermetabolic abdominopelvic lymph nodes. Incidental CT findings: Post hysterectomy.  Adnexa unremarkable SKELETON: No focal hypermetabolic activity to suggest skeletal metastasis. Incidental CT findings: none IMPRESSION: 1. No hypermetabolic activity in the LEFT hepatic lobe to localize a primary liver or bile duct carcinoma. Of note cholangiocarcinoma and metastatic carcinoma can have low metabolic activity. However, no PET-CT evidence to suggest either of these malignancies. 2. Large hiatal hernia. Electronically Signed   By: Suzy Bouchard M.D.   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 EXAM: ULTRASOUND ABDOMEN LIMITED RIGHT UPPER QUADRANT COMPARISON:  MRI abdomen from 04/22/2020 and PET-CT from 05/08/2020 FINDINGS: Limited evaluation of the upper abdomen and liver demonstrates no safe window for percutaneous biopsy of the left lobe of the liver. There is no definite sonographic window of the left lobe of the liver as it is obscured by overlying the xiphoid process and transverse colon. IMPRESSION: The left lobe of the liver is not  visualized sonographically. No safe percutaneous window for biopsy. These results were forwarded to the patient's ordering oncologist. Electronically Signed   By: Ruthann Cancer MD   On: 05/12/2020 10:10   MR ABDOMEN MRCP W WO CONTAST  Result Date: 06/01/2020 CLINICAL DATA:  Follow-up hepatic abnormality in left lobe. EXAM: MRI ABDOMEN WITHOUT AND WITH CONTRAST (INCLUDING MRCP) TECHNIQUE: Multiplanar multisequence MR imaging of the abdomen was performed both before and after the administration of intravenous contrast. Heavily T2-weighted images of the biliary and pancreatic ducts were obtained, and three-dimensional MRCP images were rendered by post processing. CONTRAST:  66mL GADAVIST GADOBUTROL 1 MMOL/ML IV SOLN COMPARISON:  MRI on 04/22/2020, and PET-CT on 05/08/2020 FINDINGS: Lower chest: No acute findings. Hepatobiliary: Diffuse atrophy of the left hepatic lobe is again seen, which shows diffuse T2 hyperintensity and hyperenhancement. Diffuse intrahepatic biliary ductal dilatation is seen within the atrophic left lobe, however no focal hepatic mass is visualized. Prior cholecystectomy noted. No evidence of dilatation involving the right intrahepatic bile ducts or common bile duct. No evidence of choledocholithiasis. Pancreas: No mass or inflammatory changes. No evidence of pancreatic ductal dilatation or pancreas divisum. Spleen:  Within normal limits in size and appearance. Adrenals/Urinary Tract: No masses identified. No evidence of  hydronephrosis. Stomach/Bowel: Large hiatal hernia is again seen. Otherwise unremarkable. Vascular/Lymphatic: No pathologically enlarged lymph nodes identified. No abdominal aortic aneurysm. Other:  None. Musculoskeletal:  No suspicious bone lesions identified. IMPRESSION: Diffuse atrophy of the left hepatic lobe with intrahepatic biliary ductal dilatation. These findings remain stable, and no focal hepatic mass identified. Consider ERCP for further evaluation. Large hiatal hernia. Electronically Signed   By: Marlaine Hind M.D.   On: 06/01/2020 12:39     ASSESSMENT:  1.  Mass of the left lobe of the liver: -CT scan of the chest on 04/09/2020 showed mass in the left hepatic lobe with atrophy of the same lobe. -MRI of the abdomen on 04/22/2020 showed a new mass in the left liver lobe since July 2019, abnormal signal and enhancement with possible intrahepatic duct dilatation, suspicious for cholangiocarcinoma.  No abdominal adenopathy. -She denies any fevers, night sweats or weight loss. -LFTs on 04/21/2020 shows mildly elevated total bilirubin of 1.4. -PET scan on 05/08/2020 shows no hypermetabolic activity in the left hepatic lobe.  Large hiatal hernia. -MRCP on 05/30/2020 shows diffuse atrophy of the left hepatic lobe with intrahepatic biliary ductal dilatation with no focal hepatic mass identified. -CA 19-9 is elevated at 88.  AFP and CEA is normal.  2.  Social/family history: -Works as a Print production planner.  Never smoker. -Mother had ovarian cancer in her 31s and father had prostate cancer.  Paternal grandfather had cirrhosis.   PLAN:  1.  Mass of the left lobe of the liver: -We have discussed her case in the tumor board. -It was recommended to proceed with MRCP which was done on 05/30/2020.  I have reviewed results with the patient and her husband.  There is diffuse atrophy of the left hepatic lobe without focal mass.  However she has intrahepatic biliary ductal dilation. -I will make a referral to  Dr. Rush Landmark for ERCP evaluation. -RTC in 8 weeks for follow-up.  2.  Mild hypercalcemia: -Calcium was 10.4 on 04/21/2020. -Intact PTH was 21.   Orders placed this encounter:  No orders of the defined types were placed in this encounter.    Derek Jack, MD [place] [phone]   I, Milinda Antis, am acting as a Education administrator for  Dr. Sanda Linger.  I, Derek Jack MD, have reviewed the above documentation for accuracy and completeness, and I agree with the above.

## 2020-06-09 ENCOUNTER — Other Ambulatory Visit: Payer: Self-pay

## 2020-06-09 ENCOUNTER — Telehealth: Payer: Self-pay

## 2020-06-09 DIAGNOSIS — K839 Disease of biliary tract, unspecified: Secondary | ICD-10-CM

## 2020-06-09 NOTE — Telephone Encounter (Signed)
I spoke with the pt and she will come in on 10/22 at 350 pm with Dr Rush Landmark.  She will have labs the day prior. Order entered.

## 2020-06-09 NOTE — Telephone Encounter (Signed)
-----   Message from Irving Copas., MD sent at 06/05/2020  2:48 PM EDT ----- Regarding: Follow up Dr. Delton Coombes, Shields like to talk with the patient and see how her LFT pattern is now currently. This will certainly not be an easy ERCP to get our wire and SpyScope into the area. If her LFTs are normal currently, it also begs the question as to what we are trying to achieve - though recognition that if there is an underlying cholangiocarcinoma that maybe she could then be a candidate for interventions. Maciah Schweigert, get this patient as an overbook with me in the next 2-3 weeks in clinic (350PM on October 21/22 should be available). She needs to have a CBC/CMP/INR completed before she sees me in clinic so I can have that information. I think if her LFT pattern is much higher or showing overt cholestasis or obstruction I wouldn't have as much concern about the ERCP attempt. Please update Dr. Delton Coombes and I as to the patient's clinic date. ----- Message ----- From: Derek Jack, MD Sent: 06/03/2020   5:11 PM EDT To: Irving Copas., MD  GM, This is a lady we discussed at previous GI tumor board for atrophy of the left hepatic lobe and intrahepatic biliary dilatation.  Her MRCP did not show any focal liver mass but continued to show intrahepatic biliary dilatation.  CA 19-9 was elevated at 88.  PET scan is negative.  Can you see her for a ERCP.Please let me know your thoughts.SK

## 2020-06-12 ENCOUNTER — Other Ambulatory Visit (INDEPENDENT_AMBULATORY_CARE_PROVIDER_SITE_OTHER): Payer: BC Managed Care – PPO

## 2020-06-12 DIAGNOSIS — K839 Disease of biliary tract, unspecified: Secondary | ICD-10-CM

## 2020-06-12 LAB — CBC WITH DIFFERENTIAL/PLATELET
Basophils Absolute: 0 10*3/uL (ref 0.0–0.1)
Basophils Relative: 0.6 % (ref 0.0–3.0)
Eosinophils Absolute: 0.1 10*3/uL (ref 0.0–0.7)
Eosinophils Relative: 1.8 % (ref 0.0–5.0)
HCT: 40.2 % (ref 36.0–46.0)
Hemoglobin: 13.7 g/dL (ref 12.0–15.0)
Lymphocytes Relative: 34.4 % (ref 12.0–46.0)
Lymphs Abs: 2.4 10*3/uL (ref 0.7–4.0)
MCHC: 34.1 g/dL (ref 30.0–36.0)
MCV: 96 fl (ref 78.0–100.0)
Monocytes Absolute: 0.6 10*3/uL (ref 0.1–1.0)
Monocytes Relative: 7.9 % (ref 3.0–12.0)
Neutro Abs: 3.9 10*3/uL (ref 1.4–7.7)
Neutrophils Relative %: 55.3 % (ref 43.0–77.0)
Platelets: 242 10*3/uL (ref 150.0–400.0)
RBC: 4.18 Mil/uL (ref 3.87–5.11)
RDW: 12.2 % (ref 11.5–15.5)
WBC: 7.1 10*3/uL (ref 4.0–10.5)

## 2020-06-12 LAB — PROTIME-INR
INR: 1 ratio (ref 0.8–1.0)
Prothrombin Time: 11.7 s (ref 9.6–13.1)

## 2020-06-12 LAB — COMPREHENSIVE METABOLIC PANEL
ALT: 19 U/L (ref 0–35)
AST: 27 U/L (ref 0–37)
Albumin: 4.1 g/dL (ref 3.5–5.2)
Alkaline Phosphatase: 57 U/L (ref 39–117)
BUN: 24 mg/dL — ABNORMAL HIGH (ref 6–23)
CO2: 25 mEq/L (ref 19–32)
Calcium: 9.3 mg/dL (ref 8.4–10.5)
Chloride: 103 mEq/L (ref 96–112)
Creatinine, Ser: 0.7 mg/dL (ref 0.40–1.20)
GFR: 91.75 mL/min (ref >60.00)
Glucose, Bld: 123 mg/dL — ABNORMAL HIGH (ref 70–99)
Potassium: 4 mEq/L (ref 3.5–5.1)
Sodium: 138 mEq/L (ref 135–145)
Total Bilirubin: 1.1 mg/dL (ref 0.2–1.2)
Total Protein: 6.8 g/dL (ref 6.0–8.3)

## 2020-06-13 ENCOUNTER — Encounter: Payer: Self-pay | Admitting: Gastroenterology

## 2020-06-13 ENCOUNTER — Ambulatory Visit (INDEPENDENT_AMBULATORY_CARE_PROVIDER_SITE_OTHER): Payer: BC Managed Care – PPO | Admitting: Gastroenterology

## 2020-06-13 VITALS — BP 160/98 | HR 96 | Ht 58.5 in | Wt 154.2 lb

## 2020-06-13 DIAGNOSIS — R160 Hepatomegaly, not elsewhere classified: Secondary | ICD-10-CM

## 2020-06-13 DIAGNOSIS — K449 Diaphragmatic hernia without obstruction or gangrene: Secondary | ICD-10-CM

## 2020-06-13 DIAGNOSIS — K838 Other specified diseases of biliary tract: Secondary | ICD-10-CM

## 2020-06-13 DIAGNOSIS — K769 Liver disease, unspecified: Secondary | ICD-10-CM | POA: Diagnosis not present

## 2020-06-13 DIAGNOSIS — R932 Abnormal findings on diagnostic imaging of liver and biliary tract: Secondary | ICD-10-CM | POA: Diagnosis not present

## 2020-06-13 NOTE — Patient Instructions (Addendum)
If you are age 64 or older, your body mass index should be between 23-30. Your Body mass index is 31.69 kg/m. If this is out of the aforementioned range listed, please consider follow up with your Primary Care Provider.  If you are age 61 or younger, your body mass index should be between 19-25. Your Body mass index is 31.69 kg/m. If this is out of the aformentioned range listed, please consider follow up with your Primary Care Provider.   You have been scheduled for an endoscopy. Please follow written instructions given to you at your visit today. If you use inhalers (even only as needed), please bring them with you on the day of your procedure.   Thank you for choosing me and [facility].  Dr. Rush Landmark

## 2020-06-13 NOTE — Progress Notes (Signed)
[place] VISIT   Primary Care Provider Chevis Pretty, [place] [address] [place] [address] [phone]  Referring Provider Chevis Pretty, [address] [place],  [place] [id number] [phone]  Patient Profile: Amber Winters is a 64 y.o. 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  The patient presents to the [facility] for an evaluation and management of problem(s) noted below:  Problem List 1. Intrahepatic bile duct dilation   2. Common bile duct dilation   3. Abnormal MRI, liver   4. Liver lesion, left lobe   5. Hiatal hernia     History of Present Illness This is the patient's first visit to the outpatient [employer] clinic.  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  She underwent CT chest imaging for follow-up of steroid therapy in August.  At that time, a new area of masslike low-attenuation was found in the left hepatic lobe with adjacent left hepatic lobe atrophy.  There was concern for some potential underlying malignancy for which a MRI abdomen was then performed.  The MRI as noted below showed findings consistent with an atrophic left liver and a hypointense region and possible intrahepatic biliary dilation.  She underwent a head CT that was unremarkable for finding of a liver mass.  A CA 19-9 was sent and was slightly elevated (greater than 80).  She was set up for an attempt at liver biopsy but this was not able to be visualized by interventional radiology and no biopsy was performed.  Her case was discussed by Dr. Delton Coombes at a [facility] and plan had been for repeat MRI/MRCP to be performed.  This was performed earlier this month with results as below showing atrophy of the left hepatic lobe with some mild  intrahepatic biliary duct dilation but no focal hepatic mass.  Patient was referred for consideration of ERCP.  Liver tests were obtained prior to today's visit and are completely normal.  The patient is accompanied by her husband today.  She has no GI complaints.  She just wants to move forward and get an answer to what is going on in her liver.  There is no family history of any GI malignancies.  She underwent cholecystectomy years ago.  GI Review of Systems Positive as above Negative for pyrosis, dysphagia, odynophagia, nausea, vomiting, pain, decreased appetite, early satiety, change in bowel habits, melena, hematochezia  Review of Systems General: Denies fevers/chills/weight loss unintentionally Cardiovascular: Denies chest pain/palpitations Pulmonary: Denies shortness of breath Gastroenterological: See HPI Genitourinary: Denies darkened urine Hematological: Denies easy bruising/bleeding Dermatological: Denies jaundice Psychological: Mood is stable   Medications Current Outpatient Medications  Medication Sig Dispense Refill  . cephALEXin (KEFLEX) 250 MG capsule Take 250 mg by mouth daily.    . predniSONE (DELTASONE) 1 MG tablet Take 1 tablet (1 mg total) by mouth daily with breakfast. -Time to slowly stop prednisone: Take 5 mg once daily for 2 weeks and then 5 mg Monday Wednesday and Friday for 2 weeks and then 2 mg Monday Wednesday Friday for 2 weeks and then 1 mg Monday Wednesday Friday for 2 weeks and stop 142 tablet 0   No current facility-administered medications for this visit.    Allergies Allergies  Allergen Reactions  . Ciprofloxacin Other (See Comments)    Patient states this causes severe muscle aching and discomfort.  . Nitrofurantoin  Other (See Comments)    ILD  . Asa [Aspirin]     Tinnitis  . Sulfa Antibiotics Nausea And Vomiting  . Zithromax [Azithromycin]     Abdominal cramps    Histories Past Medical History:  Diagnosis Date  . Hyperlipidemia    Past  Surgical History:  Procedure Laterality Date  . ABDOMINAL HYSTERECTOMY  2000  . BRONCHIAL WASHINGS  11/12/2019   Procedure: BRONCHIAL WASHINGS;  Surgeon: Brand Males, MD;  Location: [facility];  Service: Endoscopy;;  . CATARACT EXTRACTION, BILATERAL    . CHOLECYSTECTOMY  2001  . HERNIA REPAIR    . URETHRAL SLING    . VIDEO BRONCHOSCOPY  11/12/2019   Procedure: VIDEO BRONCHOSCOPY WITH FLUORO;  Surgeon: Brand Males, MD;  Location: [facility];  Service: Endoscopy;;   Social History   Socioeconomic History  . Marital status: Married    Spouse name: Not on file  . Number of children: 2  . Years of education: Not on file  . Highest education level: Not on file  Occupational History  . Not on file  Tobacco Use  . Smoking status: Never Smoker  . Smokeless tobacco: Never Used  Vaping Use  . Vaping Use: Never used  Substance and Sexual Activity  . Alcohol use: No  . Drug use: No  . Sexual activity: Not on file  Other Topics Concern  . Not on file  Social History Narrative  . Not on file   Social Determinants of Health   Financial Resource Strain:   . Difficulty of Paying Living Expenses: Not on file  Food Insecurity:   . Worried About Charity fundraiser in the Last Year: Not on file  . Ran Out of Food in the Last Year: Not on file  Transportation Needs:   . Lack of Transportation (Medical): Not on file  . Lack of Transportation (Non-Medical): Not on file  Physical Activity:   . Days of Exercise per Week: Not on file  . Minutes of Exercise per Session: Not on file  Stress:   . Feeling of Stress : Not on file  Social Connections:   . Frequency of Communication with Friends and Family: Not on file  . Frequency of Social Gatherings with Friends and Family: Not on file  . Attends Religious Services: Not on file  . Active Member of Clubs or Organizations: Not on file  . Attends Archivist Meetings: Not on file  . Marital Status: Not on file  Intimate  Partner Violence:   . Fear of Current or Ex-Partner: Not on file  . Emotionally Abused: Not on file  . Physically Abused: Not on file  . Sexually Abused: Not on file   Family History  Problem Relation Age of Onset  . Ovarian cancer Mother   . Anuerysm Father   . Hypertension Brother   . Healthy Daughter   . Healthy Daughter   . Colon cancer Neg Hx   . Esophageal cancer Neg Hx   . Inflammatory bowel disease Neg Hx   . Liver disease Neg Hx   . Pancreatic cancer Neg Hx   . Rectal cancer Neg Hx   . Stomach cancer Neg Hx    I have reviewed her medical, social, and family history in detail and updated the electronic medical record as necessary.    PHYSICAL EXAMINATION  BP (!) 160/98 (BP Location: Left Arm, Patient Position: Sitting, Cuff Size: Normal)   Pulse 96   Ht 4' 10.5" (1.486  m) Comment: height measured without shoes  Wt 154 lb 4 oz (70 kg)   BMI 31.69 kg/m  Wt Readings from Last 3 Encounters:  06/13/20 154 lb 4 oz (70 kg)  06/03/20 155 lb 1.6 oz (70.4 kg)  05/01/20 151 lb 6.4 oz (68.7 kg)  GEN: NAD, appears stated age, doesn't appear chronically ill, accompanied by husband PSYCH: Cooperative, without pressured speech EYE: Conjunctivae pink, sclerae anicteric ENT: MMM CV: Nontachycardic RESP: No audible wheezing GI: NABS, soft, NT/ND, without rebound or guarding, no HSM appreciated MSK/EXT: No lower extremity edema SKIN: No jaundice, no spider angiomata NEURO:  Alert & Oriented x 3, no focal deficits, no evidence of asterixis   REVIEW OF DATA  I reviewed the following data at the time of this encounter:  GI Procedures and Studies  No relevant studies to review in our records  Laboratory Studies  Reviewed those in epic  Imaging Studies  August 2021 CT chest IMPRESSION: 1. Interval clearing of previously seen peribronchovascular ground-glass, indicative of a resolved infectious or inflammatory process. 2. No evidence of interstitial lung disease. 3. New  area of masslike low-attenuation in the left hepatic lobe with left hepatic lobe atrophy. Further evaluation with MR abdomen without and with contrast is recommended as malignancy cannot be excluded. These results will be called to the ordering clinician or representative by the Radiologist Assistant, and communication documented in the PACS or [employer]. 4. 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 5. Large hiatal hernia.  August 2021 MRI abdomen IMPRESSION: 1. Since 03/02/2018, development of lateral segment left liver lobe atrophy, abnormal signal and enhancement with possible intrahepatic duct dilatation. 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 An atypical appearance of hepatocellular carcinoma or metastatic disease felt less likely. Consider sampling. 2. Hiatal hernia. 3. No abdominal adenopathy.  September 2021 PET scan IMPRESSION: 1. No hypermetabolic activity in the LEFT hepatic lobe to localize a primary liver or bile duct carcinoma. Of note cholangiocarcinoma and metastatic carcinoma can have low metabolic activity. However, no PET-CT evidence to suggest either of these malignancies. 2. Large hiatal hernia.  September 2021 abdominal ultrasound attempt at biopsy IMPRESSION: The left lobe of the liver is not visualized sonographically. No safe percutaneous window for biopsy.  October 2021 MRI/MRCP IMPRESSION: Diffuse atrophy of the left hepatic lobe with intrahepatic biliary ductal dilatation. These findings remain stable, and no focal hepatic mass identified. Consider ERCP for further evaluation. Large hiatal hernia.   ASSESSMENT  Ms. Keeling is a 64 y.o. 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  The patient is seen today for evaluation and management of:  1. Intrahepatic bile duct  dilation   2. Common bile duct dilation   3. Abnormal MRI, liver   4. Liver lesion, left lobe   5. Hiatal hernia    The patient is hemodynamically stable.  Clinically she is doing well as well.  The patient has completely normal liver biochemical testing.  I have reviewed the imaging from earlier this year as well as the most recent MRI/MRCP.  We also reviewed this together with the patient and her husband.  I do see some potential for intrahepatic biliary dilation but it is not severe.  She has extrahepatic biliary duct dilation likely as a result of her postcholecystectomy state.  In the setting of a completely normal LFTs, it is hard for me to feel overtly justified  to proceed with a direct ERCP.  However, the patient's elevated CA 19-9 does bring it back.  The risks of an ERCP were discussed at length, including but not limited to the risk of perforation, bleeding, abdominal pain, post-ERCP pancreatitis (while usually mild can be severe and even life threatening).  We certainly may be able to access the biliary tree and try to get a wire into the left system although I do not think that I can actually get a cholangioscope up that high as result of the near normal intrahepatics right at the hilum.  Certainly biliary brushings can be obtained if we can get a wire up into that area but that is not guaranteed.  I do think it is worthwhile for [place] to review the imaging once again and the patient's clinical history with the multidisciplinary conference to determine a final course of action.  If after [place] discussion is felt that earlier repeat imaging should be obtained and close monitoring of LFTs and we certainly can pursue that otherwise will pursue an ERCP as a diagnostic intervention and procedure.  All patient questions were answered to the best of my ability, and the patient agrees to the aforementioned plan of action with follow-up as indicated.   PLAN  Proceed with scheduling ERCP for attempt at  access of the left system and biliary brushings to be obtained to rule out cholangiocarcinoma Re-discuss case at [facility] in the coming weeks   Orders Placed This Encounter  Procedures  . Procedural/ Surgical Case Request: ENDOSCOPIC RETROGRADE CHOLANGIOPANCREATOGRAPHY (ERCP) WITH PROPOFOL  . Ambulatory referral to Gastroenterology    New Prescriptions   No medications on file   Modified Medications   No medications on file    Planned Follow Up No follow-ups on file.   Total Time in Face-to-Face and in Coordination of Care for patient including independent/personal interpretation/review of prior testing, medical history, examination, medication adjustment, communicating results with the patient directly, and documentation with the EHR is 60 minutes.   Justice Britain, MD [facility] Office # [phone]

## 2020-06-16 ENCOUNTER — Telehealth: Payer: Self-pay | Admitting: Gastroenterology

## 2020-06-16 ENCOUNTER — Encounter: Payer: Self-pay | Admitting: Gastroenterology

## 2020-06-16 DIAGNOSIS — K838 Other specified diseases of biliary tract: Secondary | ICD-10-CM | POA: Insufficient documentation

## 2020-06-16 DIAGNOSIS — R932 Abnormal findings on diagnostic imaging of liver and biliary tract: Secondary | ICD-10-CM | POA: Insufficient documentation

## 2020-06-16 DIAGNOSIS — K449 Diaphragmatic hernia without obstruction or gangrene: Secondary | ICD-10-CM | POA: Insufficient documentation

## 2020-06-16 DIAGNOSIS — K769 Liver disease, unspecified: Secondary | ICD-10-CM | POA: Insufficient documentation

## 2020-06-16 NOTE — Telephone Encounter (Signed)
Returned pt's call. Pt has been r/s to 07/21/20 @ 7:30am TOA: 6:00am. Covid Test-@ [facility] loc: 07/16/20@ 9:00am.

## 2020-06-22 ENCOUNTER — Other Ambulatory Visit: Payer: Self-pay | Admitting: Internal Medicine

## 2020-06-23 NOTE — Telephone Encounter (Signed)
MR, please advise if you are okay refilling pt's med.

## 2020-06-25 ENCOUNTER — Other Ambulatory Visit: Payer: Self-pay

## 2020-06-25 ENCOUNTER — Telehealth: Payer: Self-pay | Admitting: Gastroenterology

## 2020-06-25 DIAGNOSIS — R932 Abnormal findings on diagnostic imaging of liver and biliary tract: Secondary | ICD-10-CM

## 2020-06-25 DIAGNOSIS — K838 Other specified diseases of biliary tract: Secondary | ICD-10-CM

## 2020-06-25 DIAGNOSIS — K769 Liver disease, unspecified: Secondary | ICD-10-CM

## 2020-06-25 NOTE — Telephone Encounter (Signed)
This patient's case was discussed at multidisciplinary conference on 06/25/2020. Group consensus after discussion and review of imaging and clinical history is such that ERCP is a high risk procedure in what is not clearly defined as true intrahepatic biliary ductal dilation but rather continued atrophic appearing left lobe of the liver.  There may have been even changes (not to this extent) going back however 09/2017 CT abdomen/pelvis that was performed for another reason although the report of that did not suggest abnormalities this was more comment from our radiology colleagues upon review of prior imaging. In the setting of normal LFT pattern it was felt that ERCP should be held unless there was continued evidence of elevated CA 19-9. If the CA 19-9 remains elevated then an attempt at ERCP would be felt to be worth the potential risks of pancreatitis and other complications. If the CA 19-9 has normalized or has decreased then imaging was recommended. The overall clinical concern in this patient for intrahepatic cholangiocarcinoma is low but certainly something we still need to monitor closely. If the CA 19-9 has normalized or has decreased then the plan will be for repeat MRI/MRCP in 3 to 4 months with repeat CA 19-9 and LFTs at that time as well. I called and spoke with the patient about the discussion we had at [facility]. She understands. She will come in to the office or to [facility] (will see where she can go) to have a CA 19-9 performed. She will be away for the next few days as she helps her mother with an upcoming surgery. We will have our staff work on this. I will update the patient's PCP as well. Dr. Delton Coombes was in attendance at today's [facility]. We appreciate all of the contributors to the morning.  Justice Britain, MD [facility] Advanced [facility]oscopy Office # [phone]

## 2020-06-25 NOTE — Telephone Encounter (Signed)
Lab order entered for pt to come at any [facility] facility

## 2020-06-27 ENCOUNTER — Ambulatory Visit: Payer: BC Managed Care – PPO | Admitting: Nurse Practitioner

## 2020-06-30 ENCOUNTER — Other Ambulatory Visit (HOSPITAL_COMMUNITY): Payer: Self-pay

## 2020-06-30 ENCOUNTER — Telehealth: Payer: Self-pay | Admitting: Gastroenterology

## 2020-06-30 DIAGNOSIS — K769 Liver disease, unspecified: Secondary | ICD-10-CM

## 2020-06-30 NOTE — Telephone Encounter (Signed)
I spoke with the cancer center and she has been advised that [facility] has called about her lab CA 19-9.  She states she will call that office to see if she needs an appt or if she can just walk in.  If she can not get an appt she will come in to our office at her convenience.  No appt is needed.

## 2020-07-02 ENCOUNTER — Other Ambulatory Visit: Payer: Self-pay

## 2020-07-02 ENCOUNTER — Inpatient Hospital Stay (HOSPITAL_COMMUNITY): Payer: BC Managed Care – PPO | Attending: Hematology

## 2020-07-02 DIAGNOSIS — Z8042 Family history of malignant neoplasm of prostate: Secondary | ICD-10-CM | POA: Insufficient documentation

## 2020-07-02 DIAGNOSIS — K769 Liver disease, unspecified: Secondary | ICD-10-CM

## 2020-07-02 DIAGNOSIS — R978 Other abnormal tumor markers: Secondary | ICD-10-CM | POA: Diagnosis not present

## 2020-07-02 DIAGNOSIS — K7689 Other specified diseases of liver: Secondary | ICD-10-CM | POA: Insufficient documentation

## 2020-07-02 DIAGNOSIS — Z8041 Family history of malignant neoplasm of ovary: Secondary | ICD-10-CM | POA: Diagnosis not present

## 2020-07-02 LAB — COMPREHENSIVE METABOLIC PANEL
ALT: 22 U/L (ref 0–44)
AST: 31 U/L (ref 15–41)
Albumin: 4.4 g/dL (ref 3.5–5.0)
Alkaline Phosphatase: 54 U/L (ref 38–126)
Anion gap: 11 (ref 5–15)
BUN: 26 mg/dL — ABNORMAL HIGH (ref 8–23)
CO2: 25 mmol/L (ref 22–32)
Calcium: 9.5 mg/dL (ref 8.9–10.3)
Chloride: 101 mmol/L (ref 98–111)
Creatinine, Ser: 0.66 mg/dL (ref 0.44–1.00)
GFR, Estimated: >60 mL/min (ref >60)
Glucose, Bld: 80 mg/dL (ref 70–99)
Potassium: 4.4 mmol/L (ref 3.5–5.1)
Sodium: 137 mmol/L (ref 135–145)
Total Bilirubin: 1.7 mg/dL — ABNORMAL HIGH (ref 0.3–1.2)
Total Protein: 7.3 g/dL (ref 6.5–8.1)

## 2020-07-02 LAB — CBC WITH DIFFERENTIAL/PLATELET
Abs Immature Granulocytes: 0.02 10*3/uL (ref 0.00–0.07)
Basophils Absolute: 0 10*3/uL (ref 0.0–0.1)
Basophils Relative: 0 %
Eosinophils Absolute: 0.1 10*3/uL (ref 0.0–0.5)
Eosinophils Relative: 1 %
HCT: 41.5 % (ref 36.0–46.0)
Hemoglobin: 13.7 g/dL (ref 12.0–15.0)
Immature Granulocytes: 0 %
Lymphocytes Relative: 30 %
Lymphs Abs: 2.2 10*3/uL (ref 0.7–4.0)
MCH: 32.6 pg (ref 26.0–34.0)
MCHC: 33 g/dL (ref 30.0–36.0)
MCV: 98.8 fL (ref 80.0–100.0)
Monocytes Absolute: 0.6 10*3/uL (ref 0.1–1.0)
Monocytes Relative: 8 %
Neutro Abs: 4.5 10*3/uL (ref 1.7–7.7)
Neutrophils Relative %: 61 %
Platelets: 243 10*3/uL (ref 150–400)
RBC: 4.2 MIL/uL (ref 3.87–5.11)
RDW: 11.9 % (ref 11.5–15.5)
WBC: 7.4 10*3/uL (ref 4.0–10.5)
nRBC: 0 % (ref 0.0–0.2)

## 2020-07-03 LAB — CANCER ANTIGEN 19-9: CA 19-9: 48 U/mL — ABNORMAL HIGH (ref 0–35)

## 2020-07-07 ENCOUNTER — Telehealth: Payer: Self-pay

## 2020-07-07 DIAGNOSIS — K838 Other specified diseases of biliary tract: Secondary | ICD-10-CM

## 2020-07-07 NOTE — Telephone Encounter (Signed)
-----   Message from Irving Copas., MD sent at 07/06/2020 10:46 AM EST ----- Chong Sicilian, I have reviewed the labs. Her CA19-9 has significantly decreased and her LFT pattern is stable. Please communicate with the patient that with this being the case, we are going to hold on ERCP at this time. I would like her to have repeat LFTs and a CA19-9 in 46-months. I would also like for the patient to have a MRI/MRCP in 59-months with repeat LFTs and CA19-9. Only if the Ca19-9 increases significantly at the 74-month mark would we proceed with an earlier image vs attempt at ERCP. Thanks. GM  FYI Dr. Delton Coombes & NP [place] Done ----- Message ----- From: Timothy Lasso, RN Sent: 07/03/2020   3:05 PM EST To: Irving Copas., MD

## 2020-07-07 NOTE — Telephone Encounter (Signed)
The pt has been advised and lab entered for 2 months and 4 months. Will call pt in 4 months to set up MRI MRCP.

## 2020-07-11 ENCOUNTER — Ambulatory Visit: Payer: BC Managed Care – PPO | Admitting: Nurse Practitioner

## 2020-07-14 ENCOUNTER — Other Ambulatory Visit: Payer: BC Managed Care – PPO

## 2020-07-14 ENCOUNTER — Other Ambulatory Visit: Payer: Self-pay

## 2020-07-14 DIAGNOSIS — R300 Dysuria: Secondary | ICD-10-CM

## 2020-07-14 LAB — MICROSCOPIC EXAMINATION

## 2020-07-14 LAB — URINALYSIS, COMPLETE
Bilirubin, UA: NEGATIVE
Glucose, UA: NEGATIVE
Ketones, UA: NEGATIVE
Leukocytes,UA: NEGATIVE
Nitrite, UA: NEGATIVE
Protein,UA: NEGATIVE
RBC, UA: NEGATIVE
Specific Gravity, UA: 1.01 (ref 1.005–1.030)
Urobilinogen, Ur: 0.2 mg/dL (ref 0.2–1.0)
pH, UA: 5.5 (ref 5.0–7.5)

## 2020-07-16 ENCOUNTER — Other Ambulatory Visit (HOSPITAL_COMMUNITY): Payer: BC Managed Care – PPO

## 2020-07-17 LAB — URINE CULTURE

## 2020-07-18 ENCOUNTER — Other Ambulatory Visit (HOSPITAL_COMMUNITY): Payer: BC Managed Care – PPO

## 2020-07-21 ENCOUNTER — Telehealth: Payer: Self-pay | Admitting: Nurse Practitioner

## 2020-07-21 ENCOUNTER — Ambulatory Visit (HOSPITAL_COMMUNITY): Admit: 2020-07-21 | Payer: BC Managed Care – PPO | Admitting: Gastroenterology

## 2020-07-21 ENCOUNTER — Encounter (HOSPITAL_COMMUNITY): Payer: Self-pay

## 2020-07-21 ENCOUNTER — Telehealth: Payer: Self-pay

## 2020-07-21 SURGERY — ENDOSCOPIC RETROGRADE CHOLANGIOPANCREATOGRAPHY (ERCP) WITH PROPOFOL
Anesthesia: General

## 2020-07-21 MED ORDER — AMOXICILLIN 875 MG PO TABS: 875.0000 mg | ORAL_TABLET | Freq: Two times a day (BID) | ORAL | 0 refills | Status: DC

## 2020-07-21 NOTE — Telephone Encounter (Signed)
Pt was prescribed amoxicillin for uti and usually takes a preventative Keflex, pt is wanting to know if she needs to continue to take both

## 2020-07-21 NOTE — Telephone Encounter (Signed)
Stop keflex and take amoxicillin then start back on keflex

## 2020-07-21 NOTE — Telephone Encounter (Signed)
Patient aware.

## 2020-07-21 NOTE — Telephone Encounter (Signed)
Pt returned missed call regarding lab results. Reviewed results with pt per MMMs note. Pt voiced understanding and will pick up amoxicillin at pharmacy.

## 2020-07-22 ENCOUNTER — Ambulatory Visit (HOSPITAL_COMMUNITY): Payer: BC Managed Care – PPO | Admitting: Hematology

## 2020-07-22 ENCOUNTER — Telehealth: Payer: Self-pay | Admitting: Internal Medicine

## 2020-07-22 NOTE — Telephone Encounter (Signed)
atc pt X2, line rang to fast busy signal.  Wcb.  

## 2020-07-23 NOTE — Telephone Encounter (Signed)
Called and spoke with pt who states she was exposed to someone who tested positive for covid on 11/25.  Pt stated that she did go and get covid tested 11/29 and the results have come back negative.  Pt is fully vaccinated as she received the moderna vaccine 3/3, 3/31, and also 11/23 which all can be seen under immunization tab.  Pt states that she is fine and has not had any symptoms since the exposure.  Pt is scheduled for PFT 07/25/20 at 9am and she needs to know if it is okay to keep the appt or if she does need to reschedule it.  Aaron Edelman, please advise.

## 2020-07-23 NOTE — Telephone Encounter (Signed)
Would recommend the patient obtain a preprocedure Covid test prior to the 07/25/2020 pulmonary function test.  Unfortunately the patient was tested at the 4-day interval.  Would recommend that she obtain a test 5 to 7 days after last exposure.Can we coordinate a preprocedure Covid testing.  If the patient is willing to proceed forward with this.  Then can keep PFT scheduled as is.Of the patient does not want to do preprocedure Covid testing then can cancel PFT and reschedule at next available in about 4 weeks.[name], FNP

## 2020-07-23 NOTE — Telephone Encounter (Signed)
Patient states she is too busy and would like to just reschedule the PFT. I rescheduled PFT prior to her follow up with Dr. Chase Caller for a 30 minute PFT   Nothing further needed at this time.

## 2020-08-14 ENCOUNTER — Other Ambulatory Visit (HOSPITAL_COMMUNITY): Payer: BC Managed Care – PPO

## 2020-08-29 ENCOUNTER — Other Ambulatory Visit: Payer: Self-pay

## 2020-08-29 ENCOUNTER — Ambulatory Visit (INDEPENDENT_AMBULATORY_CARE_PROVIDER_SITE_OTHER): Payer: BC Managed Care – PPO | Admitting: Internal Medicine

## 2020-08-29 ENCOUNTER — Ambulatory Visit: Payer: BC Managed Care – PPO | Admitting: Nurse Practitioner

## 2020-08-29 DIAGNOSIS — R160 Hepatomegaly, not elsewhere classified: Secondary | ICD-10-CM

## 2020-08-29 DIAGNOSIS — J849 Interstitial pulmonary disease, unspecified: Secondary | ICD-10-CM | POA: Diagnosis not present

## 2020-08-29 DIAGNOSIS — T378X5A Adverse effect of other specified systemic anti-infectives and antiparasitics, initial encounter: Secondary | ICD-10-CM

## 2020-08-29 DIAGNOSIS — Z7185 Encounter for immunization safety counseling: Secondary | ICD-10-CM

## 2020-08-29 DIAGNOSIS — R768 Other specified abnormal immunological findings in serum: Secondary | ICD-10-CM

## 2020-08-29 DIAGNOSIS — K449 Diaphragmatic hernia without obstruction or gangrene: Secondary | ICD-10-CM

## 2020-08-29 DIAGNOSIS — Z77120 Contact with and (suspected) exposure to mold (toxic): Secondary | ICD-10-CM

## 2020-08-29 DIAGNOSIS — J679 Hypersensitivity pneumonitis due to unspecified organic dust: Secondary | ICD-10-CM

## 2020-08-29 DIAGNOSIS — K219 Gastro-esophageal reflux disease without esophagitis: Secondary | ICD-10-CM

## 2020-08-29 DIAGNOSIS — Z5181 Encounter for therapeutic drug level monitoring: Secondary | ICD-10-CM

## 2020-08-29 LAB — PULMONARY FUNCTION TEST
DL/VA % pred: 117 %
DL/VA: 5.13 ml/min/mmHg/L
DLCO cor % pred: 119 %
DLCO cor: 19.23 ml/min/mmHg
DLCO unc % pred: 119 %
DLCO unc: 19.23 ml/min/mmHg
FEF 25-75 Pre: 3.17 L/sec
FEF2575-%Pred-Pre: 172 %
FEV1-%Pred-Pre: 109 %
FEV1-Pre: 2.09 L
FEV1FVC-%Pred-Pre: 114 %
FEV6-%Pred-Pre: 99 %
FEV6-Pre: 2.37 L
FEV6FVC-%Pred-Pre: 104 %
FVC-%Pred-Pre: 95 %
FVC-Pre: 2.37 L
Pre FEV1/FVC ratio: 88 %
Pre FEV6/FVC Ratio: 100 %

## 2020-08-29 NOTE — Progress Notes (Signed)
Spirometry and DLCO completed today  ?

## 2020-09-01 ENCOUNTER — Telehealth: Payer: Self-pay | Admitting: Gastroenterology

## 2020-09-01 NOTE — Telephone Encounter (Signed)
The pt has been advised that no appt is needed for lab.  She can come at her convenience.  The pt has been advised of the information and verbalized understanding.

## 2020-09-02 ENCOUNTER — Other Ambulatory Visit (INDEPENDENT_AMBULATORY_CARE_PROVIDER_SITE_OTHER): Payer: BC Managed Care – PPO

## 2020-09-02 DIAGNOSIS — K838 Other specified diseases of biliary tract: Secondary | ICD-10-CM | POA: Diagnosis not present

## 2020-09-02 LAB — HEPATIC FUNCTION PANEL
ALT: 16 U/L (ref 0–35)
AST: 23 U/L (ref 0–37)
Albumin: 4.3 g/dL (ref 3.5–5.2)
Alkaline Phosphatase: 61 U/L (ref 39–117)
Bilirubin, Direct: 0.1 mg/dL (ref 0.0–0.3)
Total Bilirubin: 1 mg/dL (ref 0.2–1.2)
Total Protein: 7.2 g/dL (ref 6.0–8.3)

## 2020-09-03 LAB — CANCER ANTIGEN 19-9: CA 19-9: 23 U/mL (ref <34)

## 2020-09-04 ENCOUNTER — Other Ambulatory Visit: Payer: Self-pay

## 2020-09-04 ENCOUNTER — Inpatient Hospital Stay (HOSPITAL_COMMUNITY): Payer: BC Managed Care – PPO | Attending: Hematology | Admitting: Hematology

## 2020-09-04 DIAGNOSIS — K769 Liver disease, unspecified: Secondary | ICD-10-CM

## 2020-09-04 NOTE — Progress Notes (Signed)
Virtual Visit via Telephone Note  I connected with Amber Winters on 09/04/20 at  4:15 PM EST by telephone and verified that I am speaking with the correct person using two identifiers.  Location: Patient: At home Provider: In the office   I discussed the limitations, risks, security and privacy concerns of performing an evaluation and management service by telephone and the availability of in person appointments. I also discussed with the patient that there may be a patient responsible charge related to this service. The patient expressed understanding and agreed to proceed.   History of Present Illness: She is seen in our clinic for a possible mass in the left liver lobe based on MRI of the abdomen on 04/22/2020.  She initially had elevated CA 19-9 at 88 with a normal CEA and AFP.  She underwent MRCP on 05/30/2020 which showed diffuse atrophy of the left hepatic lobe with intrahepatic biliary ductal dilation with no focal hepatic mass identified.   Observations/Objective: Denies any abdominal pains.  Denies any nausea vomiting or bowel changes.  She reports arthritic pains in her knees.  Assessment and Plan:  1.  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  There is diffuse atrophy of the left hepatic lobe with intrahepatic biliary ductal dilatation. - She was evaluated by Dr. Rush Landmark.  We have also discussed her case at tumor board. - Dr. Rush Landmark he recommended repeating CA 19-9.  Labs on 09/02/2020 shows normal LFTs.  CA 19-9 was 23. - She thinks gallbladder surgery which was done 20 years ago could have contributed to the atrophy of the left hepatic lobe. - Recommend follow-up in 6 months with repeat CA 19-9 and LFTs. - If the numbers are negative at that time and she is asymptomatic, we may discharge her from the clinic.   Follow Up Instructions: RTC 6 months with labs prior.   I discussed the assessment and treatment plan with the  patient. The patient was provided an opportunity to ask questions and all were answered. The patient agreed with the plan and demonstrated an understanding of the instructions.   The patient was advised to call back or seek an in-person evaluation if the symptoms worsen or if the condition fails to improve as anticipated.  I provided 11 minutes of non-face-to-face time during this encounter.   Derek Jack, MD

## 2020-09-08 NOTE — Progress Notes (Signed)
Pft appears largely stable. Will discuss at followup

## 2020-09-11 ENCOUNTER — Encounter: Payer: Self-pay | Admitting: Internal Medicine

## 2020-09-11 ENCOUNTER — Other Ambulatory Visit: Payer: Self-pay

## 2020-09-11 ENCOUNTER — Ambulatory Visit: Payer: BC Managed Care – PPO | Admitting: Internal Medicine

## 2020-09-11 VITALS — BP 122/82 | HR 88 | Temp 97.2°F | Ht 58.5 in | Wt 157.8 lb

## 2020-09-11 DIAGNOSIS — J849 Interstitial pulmonary disease, unspecified: Secondary | ICD-10-CM

## 2020-09-11 NOTE — Progress Notes (Signed)
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 reflux -Can always consider Prilosec  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  In retrospect this might be false positive because of nitrofurantoin  Plan -We will clinically follow-up.  No need for rheumatology follow-up  Liver mass, left lobe  -per GI   Follow-up -6 months do spirometry and DLCO -Return to clinic to see Dr. Chase Caller in 6 months -  30-minute visit  - decide on CT chest depending on symptoms at followup  - return sooner if needed    (Level 04: Estb 30-39 min n   visit type: on-site physical face to visit visit spent in total care time and counseling or/and coordination of care by this undersigned MD - Dr Brand Males. This includes one or more of the following on this same day 09/11/2020: pre-charting, chart review, note writing, documentation discussion of test results, diagnostic or treatment recommendations, prognosis, risks and benefits of management options, instructions, education, compliance or risk-factor reduction. It excludes time spent by the [facility] or office staff in the care of the patient . Actual time is 37 min)   SIGNATURE    Dr. Brand Males, M.D., F.C.C.P,  Pulmonary and Critical Care Medicine Staff Physician, [facility] Director - Interstitial Lung Disease  Program  [facility] [place] [facility] [place], [place], [address]  Pager: [phone], If no answer or between  15:00h - 7:00h: call [phone] Telephone: [phone]  4:41 PM 09/11/2020

## 2020-09-11 NOTE — Patient Instructions (Addendum)
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  This adds to the belief that he did indeed have hypersensitive pneumonitis either due to nitrofurantoin and or mold in the [place]  - off prednisone since Nov 2021  - Currently Jan 2022 - breathing wise you are asymptomatic, exam normal and walk test is normal ->c/w disease in remission  ppLAN  -Definitely avoid all mold exposure [glad the church is cleaned and you are tolerating visiting the church without any problems]  - cotninue clinical monitoring   Hiatal hernia with GERD  -This can be a long-term risk factor for developing pulmonary inflammation  Plan -Work with your primary care physician over time to ensure no acid reflux -Can always consider Prilosec  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  In retrospect this might be false positive because of nitrofurantoin  Plan -We will clinically follow-up.  No need for rheumatology follow-up  Liver mass, left lobe  -per GI   Follow-up -6 months do spirometry and DLCO -Return to clinic to see Dr. Chase Caller in 6 months -  30-minute visit  - decide on CT chest depending on symptoms at followup  - return sooner if needed

## 2020-10-07 ENCOUNTER — Encounter: Payer: Self-pay | Admitting: Dermatology

## 2020-10-07 ENCOUNTER — Other Ambulatory Visit: Payer: Self-pay

## 2020-10-07 ENCOUNTER — Ambulatory Visit (INDEPENDENT_AMBULATORY_CARE_PROVIDER_SITE_OTHER): Payer: BC Managed Care – PPO | Admitting: Dermatology

## 2020-10-07 DIAGNOSIS — Z1283 Encounter for screening for malignant neoplasm of skin: Secondary | ICD-10-CM | POA: Diagnosis not present

## 2020-10-07 DIAGNOSIS — L821 Other seborrheic keratosis: Secondary | ICD-10-CM | POA: Diagnosis not present

## 2020-10-07 DIAGNOSIS — L309 Dermatitis, unspecified: Secondary | ICD-10-CM

## 2020-10-13 ENCOUNTER — Other Ambulatory Visit: Payer: Self-pay

## 2020-10-13 ENCOUNTER — Other Ambulatory Visit: Payer: BC Managed Care – PPO

## 2020-10-13 DIAGNOSIS — R300 Dysuria: Secondary | ICD-10-CM

## 2020-10-13 LAB — MICROSCOPIC EXAMINATION
Epithelial Cells (non renal): NONE SEEN /hpf (ref 0–10)
RBC, Urine: NONE SEEN /hpf (ref 0–2)

## 2020-10-13 LAB — URINALYSIS, COMPLETE
Bilirubin, UA: NEGATIVE
Glucose, UA: NEGATIVE
Ketones, UA: NEGATIVE
Leukocytes,UA: NEGATIVE
Nitrite, UA: NEGATIVE
Protein,UA: NEGATIVE
RBC, UA: NEGATIVE
Specific Gravity, UA: 1.015 (ref 1.005–1.030)
Urobilinogen, Ur: 0.2 mg/dL (ref 0.2–1.0)
pH, UA: 5 (ref 5.0–7.5)

## 2020-10-14 ENCOUNTER — Ambulatory Visit: Payer: BC Managed Care – PPO | Admitting: Nurse Practitioner

## 2020-10-14 ENCOUNTER — Encounter: Payer: Self-pay | Admitting: Nurse Practitioner

## 2020-10-14 VITALS — BP 143/87 | HR 82 | Temp 96.5°F | Resp 20 | Ht <= 58 in | Wt 160.0 lb

## 2020-10-14 DIAGNOSIS — R1031 Right lower quadrant pain: Secondary | ICD-10-CM | POA: Diagnosis not present

## 2020-10-14 NOTE — Progress Notes (Signed)
   Subjective:    Patient ID: Amber Winters, female    DOB: 10-Apr-1956, 65 y.o.   MRN: [id number]   Chief Complaint: Right side pain   HPI  Pt presents today with complaints of right flank/side pain. She has a significant history of frequent UTI's and is seen by urology. She is on daily keflex. Pain began late Saturday night. The pain is present at rest (off her feet) and relieved when she sits and rests. Increasing water intake and frequency in urination seems to help the pain as well. Has not taken any OTC analgesia due to liver issues. Denies any fever or chills. Of note, she had a total hysterectomy in 2000.  Does not believe she could have injured herself recently. No history of kidney stones.   Review of Systems  Constitutional: Negative for chills, fatigue and fever.  Respiratory: Negative for cough, shortness of breath and wheezing.   Cardiovascular: Negative for chest pain and leg swelling.  Gastrointestinal: Negative for abdominal distention, abdominal pain, constipation and diarrhea.  Genitourinary: Positive for flank pain. Negative for decreased urine volume, difficulty urinating, dysuria and hematuria.  Musculoskeletal: Negative for back pain.  Neurological: Negative for dizziness, light-headedness and headaches.       Objective:   Physical Exam Constitutional:      Appearance: Normal appearance.  Cardiovascular:     Rate and Rhythm: Normal rate and regular rhythm.     Pulses: Normal pulses.     Heart sounds: Normal heart sounds.  Pulmonary:     Effort: Pulmonary effort is normal.     Breath sounds: Normal breath sounds.  Abdominal:     General: Bowel sounds are normal. There is no distension.     Palpations: Abdomen is soft.     Tenderness: There is abdominal tenderness. There is no right CVA tenderness or left CVA tenderness.     Comments: Lower right quadrant.  Musculoskeletal:        General: Normal range of motion.  Skin:    General: Skin is warm and dry.      Capillary Refill: Capillary refill takes less than 2 seconds.  Neurological:     Mental Status: She is alert and oriented to person, place, and time.  Psychiatric:        Mood and Affect: Mood normal.        Behavior: Behavior normal.        Thought Content: Thought content normal.        Judgment: Judgment normal.    BP (!) 143/87   Pulse 82   Temp (!) 96.5 F (35.8 C) (Temporal)   Resp 20   Ht 4\' 10"  (1.473 m)   Wt 160 lb (72.6 kg)   SpO2 99%   BMI 33.44 kg/m      Assessment & Plan:   NAVIA LINDAHL comes in today with chief complaint of Right side pain   Diagnosis and orders addressed:  1. Acute right lower quadrant pain Urine culture pending. Contact urologist to determine if Flowmax would be appropriate for symptom relief. Will wait on urine culture before treating Labs pending Health Maintenance reviewed Diet and exercise encouraged  Follow up plan: Follow up based on culture  Dollene Primrose, RN, BSN, FNP-Student  Mary-Margaret Hassell Done, FNP

## 2020-10-15 ENCOUNTER — Encounter: Payer: Self-pay | Admitting: Dermatology

## 2020-10-15 LAB — URINE CULTURE: Organism ID, Bacteria: NO GROWTH

## 2020-10-15 NOTE — Progress Notes (Signed)
   Follow-Up Visit   Subjective  Amber Winters is a 65 y.o. female who presents for the following: Skin Problem (Right hand x 3 weeks ago and +itch mostly gone).  Rash, rough spot right wrist area that seems to be improving Location:  Duration: 3 weeks Quality:  Associated Signs/Symptoms: Modifying Factors:  Severity:  Timing: Context: Would like general skin check  Objective  Well appearing patient in no apparent distress; mood and affect are within normal limits. Objective  Head to toe: Head to toe exam today clear. No signs of atypical moles, melanoma, or non mole skin cancer.  Objective  Left Lower Eyelid, Mid Back: Tan textured light brown 2 to 4 mm papules  Objective  Right Wrist - Posterior: 1 cm patch subtle chronic dermatitis    A full examination was performed including scalp, head, eyes, ears, nose, lips, neck, chest, axillae, abdomen, back, buttocks, bilateral upper extremities, bilateral lower extremities, hands, feet, fingers, toes, fingernails, and toenails. All findings within normal limits unless otherwise noted below.  Areas covered by undergarments not examined. Assessment & Plan    Skin exam for malignant neoplasm Head to toe  Yearly skin check  Seborrheic keratosis (2) Mid Back; Left Lower Eyelid  Benign okay to leave if stable.  Dermatitis Right Wrist - Posterior  Pandel sample x 1 given to patient. Lot # [id number] Exp 04/22/2021 By daily after bathing for 1 to 2 weeks and then contact office with status report.      I, Lavonna Monarch, MD, have reviewed all documentation for this visit.  The documentation on 10/15/20 for the exam, diagnosis, procedures, and orders are all accurate and complete.

## 2020-11-04 ENCOUNTER — Telehealth: Payer: Self-pay

## 2020-11-04 NOTE — Telephone Encounter (Signed)
-----   Message from Timothy Lasso, RN sent at 07/07/2020  2:01 PM EST ----- Her CA19-9 has significantly decreased and her LFT pattern is stable. Please communicate with the patient that with this being the case, we are going to hold on ERCP at this time. I would like her to have repeat LFTs and a CA19-9 in 46-months. I would also like for the patient to have a MRI/MRCP in 35-months with repeat LFTs and CA19-9. Only if the Ca19-9 increases significantly at the 6-month mark would we proceed with an earlier image vs attempt at ERCP. Thanks. GM

## 2020-11-04 NOTE — Telephone Encounter (Signed)
Per the lab result dated 09/02/20.  Pt needs follow up office visit.  Appt made for 12/12/20 at 1:50 pm.  Letter mailed with that information to the pt .

## 2020-12-11 ENCOUNTER — Other Ambulatory Visit (INDEPENDENT_AMBULATORY_CARE_PROVIDER_SITE_OTHER): Payer: BC Managed Care – PPO

## 2020-12-11 DIAGNOSIS — K838 Other specified diseases of biliary tract: Secondary | ICD-10-CM | POA: Diagnosis not present

## 2020-12-11 LAB — HEPATIC FUNCTION PANEL
ALT: 15 U/L (ref 0–35)
AST: 22 U/L (ref 0–37)
Albumin: 4 g/dL (ref 3.5–5.2)
Alkaline Phosphatase: 66 U/L (ref 39–117)
Bilirubin, Direct: 0.2 mg/dL (ref 0.0–0.3)
Total Bilirubin: 1.2 mg/dL (ref 0.2–1.2)
Total Protein: 7.2 g/dL (ref 6.0–8.3)

## 2020-12-12 ENCOUNTER — Ambulatory Visit (INDEPENDENT_AMBULATORY_CARE_PROVIDER_SITE_OTHER): Payer: BC Managed Care – PPO | Admitting: Gastroenterology

## 2020-12-12 ENCOUNTER — Other Ambulatory Visit: Payer: Self-pay

## 2020-12-12 ENCOUNTER — Encounter: Payer: Self-pay | Admitting: Gastroenterology

## 2020-12-12 VITALS — BP 138/88 | HR 82 | Ht <= 58 in | Wt 159.6 lb

## 2020-12-12 DIAGNOSIS — R932 Abnormal findings on diagnostic imaging of liver and biliary tract: Secondary | ICD-10-CM | POA: Diagnosis not present

## 2020-12-12 DIAGNOSIS — Z1211 Encounter for screening for malignant neoplasm of colon: Secondary | ICD-10-CM

## 2020-12-12 DIAGNOSIS — K839 Disease of biliary tract, unspecified: Secondary | ICD-10-CM

## 2020-12-12 DIAGNOSIS — K838 Other specified diseases of biliary tract: Secondary | ICD-10-CM

## 2020-12-12 DIAGNOSIS — R945 Abnormal results of liver function studies: Secondary | ICD-10-CM

## 2020-12-12 LAB — CANCER ANTIGEN 19-9: CA 19-9: 27 U/mL (ref <34)

## 2020-12-12 NOTE — Patient Instructions (Addendum)
Please follow up with Dr Rush Landmark in 6 months.  Your provider has requested that you go to the basement level for lab work 1 week before your 6 month follow up. Press "B" on the elevator. The lab is located at the first door on the left as you exit the elevator. Orders are already in our computer system (CA-19-9, hepatic function panel)  You will be due for a recall colonoscopy in 08/2027. We will send you a reminder in the mail when it gets closer to that time.  If you are age 51 or younger, your body mass index should be between 19-25. Your Body mass index is 33.36 kg/m. If this is out of the aformentioned range listed, please consider follow up with your Primary Care Provider.   Due to recent changes in healthcare laws, you may see the results of your imaging and laboratory studies on MyChart before your provider has had a chance to review them.  We understand that in some cases there may be results that are confusing or concerning to you. Not all laboratory results come back in the same time frame and the provider may be waiting for multiple results in order to interpret others.  Please give [place] 48 hours in order for your provider to thoroughly review all the results before contacting the office for clarification of your results.

## 2020-12-14 ENCOUNTER — Encounter: Payer: Self-pay | Admitting: Gastroenterology

## 2020-12-14 DIAGNOSIS — Z1211 Encounter for screening for malignant neoplasm of colon: Secondary | ICD-10-CM | POA: Insufficient documentation

## 2020-12-14 DIAGNOSIS — K839 Disease of biliary tract, unspecified: Secondary | ICD-10-CM | POA: Insufficient documentation

## 2020-12-14 NOTE — Progress Notes (Signed)
[facility]ISIT   Primary Care Provider [address] Pretty, [address] [place] [address] [phone]  Patient Profile: Amber Winters is a 65 y.o. 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  The patient presents to the [facility] for an evaluation and management of problem(s) noted below:  Problem List 1. Intrahepatic bile duct dilation   2. Abnormal MRI, liver   3. Bile duct abnormality   4. Colon cancer screening     History of Present Illness Please see initial consultation note for full details of HPI.  Interval History Today, the patient returns for scheduled follow-up.  She seems to be significantly improved since our last visit regards to her lung status.  The patient denies any issues with jaundice, scleral icterus, generalized pruritus, darkened/amber urine, clay-colored stools, LE edema, hematemesis, coffee-ground emesis, abdominal distention, confusion.  Patient denies any significant abdominal pain or discomforts.  No changes in her bowel habits.  GI Review of Systems Positive as above Negative for dysphagia, odynophagia, nausea, vomiting, melena, hematochezia  Review of Systems General: Denies fevers/chills/weight loss unintentionally Cardiovascular: Denies chest pain/palpitations Pulmonary: Denies shortness of breath Gastroenterological: See HPI Genitourinary: Denies darkened urine Hematological: Denies easy bruising/bleeding Dermatological: Denies jaundice Psychological: Mood is stable   Medications Current Outpatient Medications  Medication Sig Dispense Refill  . Ascorbic Acid (VITAMIN C) 100 MG tablet Take 100 mg by mouth daily.    . cephALEXin (KEFLEX) 250 MG capsule Take 250 mg by  mouth daily.    . cyanocobalamin 100 MCG tablet Take 100 mcg by mouth daily.    Marland Kitchen MAGNESIUM PO Take 1 tablet by mouth daily.    . NON FORMULARY Take 1 tablet by mouth daily. Liver health    . VITAMIN D PO Take 1 tablet by mouth daily.    . vitamin E 1000 UNIT capsule Take 1,000 Units by mouth daily.     No current facility-administered medications for this visit.    Allergies Allergies  Allergen Reactions  . Ciprofloxacin Other (See Comments)    Patient states this causes severe muscle aching and discomfort.  . Nitrofurantoin Other (See Comments)    ILD  . Asa [Aspirin]     Tinnitis  . Sulfa Antibiotics Nausea And Vomiting  . Zithromax [Azithromycin]     Abdominal cramps    Histories Past Medical History:  Diagnosis Date  . Hyperlipidemia    Past Surgical History:  Procedure Laterality Date  . ABDOMINAL HYSTERECTOMY  2000  . BRONCHIAL WASHINGS  11/12/2019   Procedure: BRONCHIAL WASHINGS;  Surgeon: Brand Males, MD;  Location: [facility];  Service: Endoscopy;;  . CATARACT EXTRACTION, BILATERAL    . CHOLECYSTECTOMY  2001  . HERNIA REPAIR    . URETHRAL SLING    . VIDEO BRONCHOSCOPY  11/12/2019   Procedure: VIDEO BRONCHOSCOPY WITH FLUORO;  Surgeon: Brand Males, MD;  Location: [facility];  Service: Endoscopy;;   Social History   Socioeconomic History  . Marital status: Married    Spouse name: Not on file  . Number of children: 2  . Years of education: Not on file  . Highest education level: Not on file  Occupational History  . Not on file  Tobacco Use  . Smoking status: Never Smoker  . Smokeless tobacco: Never Used  Vaping Use  . Vaping Use:  Never used  Substance and Sexual Activity  . Alcohol use: No  . Drug use: No  . Sexual activity: Not Currently  Other Topics Concern  . Not on file  Social History Narrative  . Not on file   Social Determinants of Health   Financial Resource Strain: Not on file  Food Insecurity: Not on file   Transportation Needs: Not on file  Physical Activity: Not on file  Stress: Not on file  Social Connections: Not on file  Intimate Partner Violence: Not on file   Family History  Problem Relation Age of Onset  . Ovarian cancer Mother   . Anuerysm Father   . Hypertension Brother   . Healthy Daughter   . Healthy Daughter   . Colon cancer Neg Hx   . Esophageal cancer Neg Hx   . Inflammatory bowel disease Neg Hx   . Liver disease Neg Hx   . Pancreatic cancer Neg Hx   . Rectal cancer Neg Hx   . Stomach cancer Neg Hx    I have reviewed her medical, social, and family history in detail and updated the electronic medical record as necessary.    PHYSICAL EXAMINATION  BP 138/88   Pulse 82   Ht 4\' 10"  (1.473 m)   Wt 159 lb 9.6 oz (72.4 kg)   SpO2 99%   BMI 33.36 kg/m  Wt Readings from Last 3 Encounters:  12/12/20 159 lb 9.6 oz (72.4 kg)  10/14/20 160 lb (72.6 kg)  09/11/20 157 lb 12.8 oz (71.6 kg)  GEN: NAD, appears stated age, doesn't appear chronically ill, accompanied by husband PSYCH: Cooperative, without pressured speech EYE: Conjunctivae pink, sclerae anicteric ENT: Masked CV: Nontachycardic RESP: No audible wheezing GI: NABS, soft, NT/ND, without rebound MSK/EXT: No lower extremity edema SKIN: No jaundice, no spider angiomata NEURO:  Alert & Oriented x 3, no focal deficits, no evidence of asterixis   REVIEW OF DATA  I reviewed the following data at the time of this encounter:  GI Procedures and Studies  Patient reports 2 previous colonoscopies done at [facility] and they were both normal with plan for 10-year follow-up with last one being in 2019  Laboratory Studies  Reviewed those in epic Hepatic function panel is normal CA 19-9 pending from yesterday  Imaging Studies  October 2021 MRI/MRCP IMPRESSION: Diffuse atrophy of the left hepatic lobe with intrahepatic biliary ductal dilatation. These findings remain stable, and no focal hepatic mass  identified. Consider ERCP for further evaluation. Large hiatal hernia.   ASSESSMENT  [name] is a 65 y.o. 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  The patient is seen today for evaluation and management of:  1. Intrahepatic bile duct dilation   2. Abnormal MRI, liver   3. Bile duct abnormality   4. Colon cancer screening    The patient is clinically and hemodynamically stable.  Hepatic function panel is normal.  We will be awaiting her CA 19-9.  If this rises will significantly consider role of ERCP diagnostic to evaluate the area of abnormality on the MRI/MRCP.  Tentative plan based on things being normal will be repeat CA 19-9 and hepatic function panel in 6 months.  MRI/MRCP likely to be obtained course in the coming months and I will defer that to our oncology colleagues at this time.  As long as  no significant changes are noted then hopefully we will just be able to begin monitoring this once yearly with LFTs being checked at least 2-3 times daily.  We are holding on ERCP for now.  Her screening colonoscopy will be due in 2029.  All patient questions were answered to the best of my ability, and the patient agrees to the aforementioned plan of action with follow-up as indicated.   PLAN  Follow-up pending CA 19-9 Only if CA 19-9 has elevated significantly will we consider ERCP at this time CA 19-9/hepatic function panel to be obtained in 6 months with follow-up in clinic Follow-up with oncology as already scheduled with potential repeat MRI/MRCP near that time Colonoscopy for colon cancer screening due in 2029   Orders Placed This Encounter  Procedures  . Cancer antigen 19-9  . Hepatic function panel    New Prescriptions   No medications on file   Modified Medications    No medications on file    Planned Follow Up Return in about 6 months (around 06/13/2021).   Total Time in Face-to-Face and in Coordination of Care for patient including independent/personal interpretation/review of prior testing, medical history, examination, medication adjustment, communicating results with the patient directly, and documentation with the EHR is 25 minutes.   Justice Britain, MD [facility] Office # [phone]

## 2021-03-12 ENCOUNTER — Inpatient Hospital Stay (HOSPITAL_COMMUNITY): Payer: BC Managed Care – PPO

## 2021-03-12 ENCOUNTER — Other Ambulatory Visit (HOSPITAL_COMMUNITY): Payer: BC Managed Care – PPO

## 2021-03-19 ENCOUNTER — Ambulatory Visit (HOSPITAL_COMMUNITY): Payer: BC Managed Care – PPO | Admitting: Hematology

## 2021-04-02 ENCOUNTER — Other Ambulatory Visit: Payer: Self-pay

## 2021-04-02 ENCOUNTER — Inpatient Hospital Stay (HOSPITAL_COMMUNITY): Payer: BC Managed Care – PPO | Attending: Hematology

## 2021-04-02 ENCOUNTER — Inpatient Hospital Stay (HOSPITAL_COMMUNITY): Payer: BC Managed Care – PPO

## 2021-04-02 DIAGNOSIS — Z8249 Family history of ischemic heart disease and other diseases of the circulatory system: Secondary | ICD-10-CM | POA: Diagnosis not present

## 2021-04-02 DIAGNOSIS — Z8041 Family history of malignant neoplasm of ovary: Secondary | ICD-10-CM | POA: Diagnosis not present

## 2021-04-02 DIAGNOSIS — K769 Liver disease, unspecified: Secondary | ICD-10-CM | POA: Insufficient documentation

## 2021-04-02 LAB — HEPATIC FUNCTION PANEL
ALT: 18 U/L (ref 0–44)
AST: 23 U/L (ref 15–41)
Albumin: 4.2 g/dL (ref 3.5–5.0)
Alkaline Phosphatase: 72 U/L (ref 38–126)
Bilirubin, Direct: 0.1 mg/dL (ref 0.0–0.2)
Indirect Bilirubin: 1.5 mg/dL — ABNORMAL HIGH (ref 0.3–0.9)
Total Bilirubin: 1.6 mg/dL — ABNORMAL HIGH (ref 0.3–1.2)
Total Protein: 7.7 g/dL (ref 6.5–8.1)

## 2021-04-03 LAB — CANCER ANTIGEN 19-9: CA 19-9: 39 U/mL — ABNORMAL HIGH (ref 0–35)

## 2021-04-08 NOTE — Progress Notes (Signed)
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    CANCER STAGING: Cancer Staging No matching staging information was found for the patient.  INTERVAL HISTORY:  [name], a 65 y.o. female, returns for routine follow-up of her mass of the left lobe of the liver. [name] was last seen on 09/04/20.   Today she reports feeling good. She has plantar fascitis in her right foot which is causing her pain. She reports occasional indigestion after eating late at night. She denies n/v/d and reports good appetite.  REVIEW OF SYSTEMS:  Review of Systems  Constitutional:  Negative for appetite change and fatigue.  Gastrointestinal:  Negative for diarrhea, nausea and vomiting.  Musculoskeletal:  Positive for arthralgias (1/10 R foot).  All other systems reviewed and are negative.  PAST MEDICAL/SURGICAL HISTORY:  Past Medical History:  Diagnosis Date   Hyperlipidemia    Past Surgical History:  Procedure Laterality Date   ABDOMINAL HYSTERECTOMY  2000   BRONCHIAL WASHINGS  11/12/2019   Procedure: BRONCHIAL WASHINGS;  Surgeon: Brand Males, MD;  Location: [facility];  Service: Endoscopy;;   CATARACT EXTRACTION, BILATERAL     CHOLECYSTECTOMY  2001   HERNIA REPAIR     URETHRAL SLING     VIDEO BRONCHOSCOPY  11/12/2019   Procedure: VIDEO BRONCHOSCOPY WITH FLUORO;  Surgeon: Brand Males, MD;  Location: [facility];  Service: Endoscopy;;    SOCIAL HISTORY:  Social History   Socioeconomic History   Marital status: Married    Spouse name: Not on file   Number of children: 2   Years of education: Not on file    Highest education level: Not on file  Occupational History   Not on file  Tobacco Use   Smoking status: Never   Smokeless tobacco: Never  Vaping Use   Vaping Use: Never used  Substance and Sexual Activity   Alcohol use: No   Drug use: No   Sexual activity: Not Currently  Other Topics Concern   Not on file  Social History Narrative   Not on file   Social Determinants of Health   Financial Resource Strain: Not on file  Food Insecurity: Not on file  Transportation Needs: Not on file  Physical Activity: Not on file  Stress: Not on file  Social Connections: Not on file  Intimate Partner Violence: Not on file    FAMILY HISTORY:  Family History  Problem Relation Age of Onset   Ovarian cancer Mother    Anuerysm Father    Hypertension Brother    Healthy Daughter    Healthy Daughter    Colon cancer Neg Hx    Esophageal cancer Neg Hx    Inflammatory bowel disease Neg Hx    Liver disease Neg Hx    Pancreatic cancer Neg Hx    Rectal cancer Neg Hx    Stomach cancer Neg Hx     CURRENT MEDICATIONS:  Current Outpatient Medications  Medication Sig Dispense Refill   Ascorbic Acid (VITAMIN C) 100 MG tablet Take 100 mg by mouth daily.     cephALEXin (KEFLEX) 250 MG capsule Take 250  mg by mouth daily.     cyanocobalamin 100 MCG tablet Take 100 mcg by mouth daily.     MAGNESIUM PO Take 1 tablet by mouth daily.     NON FORMULARY Take 1 tablet by mouth daily. Liver health     VITAMIN D PO Take 1 tablet by mouth daily.     vitamin E 1000 UNIT capsule Take 1,000 Units by mouth daily.     No current facility-administered medications for this visit.    ALLERGIES:  Allergies  Allergen Reactions   Ciprofloxacin Other (See Comments)    Patient states this causes severe muscle aching and discomfort.   Nitrofurantoin Other (See Comments)    ILD   Asa [Aspirin]     Tinnitis   Sulfa Antibiotics Nausea And Vomiting   Zithromax [Azithromycin]     Abdominal cramps    PHYSICAL EXAM:   Performance status (ECOG): 0 - Asymptomatic  There were no vitals filed for this visit. Wt Readings from Last 3 Encounters:  12/12/20 159 lb 9.6 oz (72.4 kg)  10/14/20 160 lb (72.6 kg)  09/11/20 157 lb 12.8 oz (71.6 kg)   Physical Exam Vitals reviewed.  Constitutional:      Appearance: Normal appearance. She is obese.  Cardiovascular:     Rate and Rhythm: Normal rate and regular rhythm.     Pulses: Normal pulses.     Heart sounds: Normal heart sounds.  Pulmonary:     Effort: Pulmonary effort is normal.     Breath sounds: Normal breath sounds.  Abdominal:     Palpations: Abdomen is soft. There is no hepatomegaly, splenomegaly or mass.     Tenderness: There is no abdominal tenderness.  Neurological:     General: No focal deficit present.     Mental Status: She is alert and oriented to person, place, and time.  Psychiatric:        Mood and Affect: Mood normal.        Behavior: Behavior normal.     LABORATORY DATA:  I have reviewed the labs as listed.  CBC Latest Ref Rng & Units 07/02/2020 06/12/2020 05/09/2020  WBC 4.0 - 10.5 K/uL 7.4 7.1 10.0  Hemoglobin 12.0 - 15.0 g/dL 13.7 13.7 14.6  Hematocrit 36.0 - 46.0 % 41.5 40.2 43.5  Platelets 150 - 400 K/uL 243 242.0 266   CMP Latest Ref Rng & Units 04/02/2021 12/11/2020 09/02/2020  Glucose 70 - 99 mg/dL - - -  BUN 8 - 23 mg/dL - - -  Creatinine 0.44 - 1.00 mg/dL - - -  Sodium 135 - 145 mmol/L - - -  Potassium 3.5 - 5.1 mmol/L - - -  Chloride 98 - 111 mmol/L - - -  CO2 22 - 32 mmol/L - - -  Calcium 8.9 - 10.3 mg/dL - - -  Total Protein 6.5 - 8.1 g/dL 7.7 7.2 7.2  Total Bilirubin 0.3 - 1.2 mg/dL 1.6(H) 1.2 1.0  Alkaline Phos 38 - 126 U/L 72 66 61  AST 15 - 41 U/L '23 22 23  '$ ALT 0 - 44 U/L '18 15 16    '$ DIAGNOSTIC IMAGING:  I have independently reviewed the scans and discussed with the patient. No results found.   ASSESSMENT:  1.  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 -MRI of the abdomen on 04/22/2020 showed a new mass in  the left liver lobe since July 2019, abnormal signal and enhancement with possible intrahepatic duct dilatation, suspicious for cholangiocarcinoma.  No abdominal adenopathy. -She denies any fevers, night sweats or weight loss. -LFTs on 04/21/2020 shows mildly elevated total bilirubin of 1.4. -PET scan on 05/08/2020 shows no hypermetabolic activity in the left hepatic lobe.  Large hiatal hernia. -MRCP on 05/30/2020 shows diffuse atrophy of the left hepatic lobe with intrahepatic biliary ductal dilatation with no focal hepatic mass identified. -CA 19-9 is elevated at 88.  AFP and CEA is normal. - She had gallbladder surgery that was done 20 years ago which could have contributed to the atrophy of the left hepatic lobe.   2.  Social/family history: -Works as a Print production planner.  Never smoker. -Mother had ovarian cancer in her 17s and father had prostate cancer.  Paternal grandfather had cirrhosis  PLAN:  1.  Intrahepatic biliary ductal dilatation: - She does not report any nausea, vomiting or abdominal pain.  No dyspepsia symptoms. - She reportedly had first dose of Shingrix vaccine few weeks ago. - Reviewed her labs from 04/02/2021.  CA 19-9 has increased to 39.  This has normalized on 12/11/2020.  Total bilirubin has also increased to 1.6, indirect bilirubin 1.5.  Other LFTs are normal.  Physical examination today did not reveal any palpable masses. - Recommend follow-up in 3 months with repeat labs.  She is planning on going to a cruise to [place] and will have the second dose of Shingrix after that. - If the labs remain elevated at next visit, will consider repeating MRI/MRCP.   Orders placed this encounter:  No orders of the defined types were placed in this encounter.    Derek Jack, MD [place] [phone]   I, Thana Ates, am acting as a scribe for Dr.  Derek Jack.  I, Derek Jack MD, have reviewed the above documentation for accuracy and completeness, and I agree with the above.

## 2021-04-09 ENCOUNTER — Inpatient Hospital Stay (HOSPITAL_BASED_OUTPATIENT_CLINIC_OR_DEPARTMENT_OTHER): Payer: BC Managed Care – PPO | Admitting: Hematology

## 2021-04-09 ENCOUNTER — Ambulatory Visit (HOSPITAL_COMMUNITY): Payer: BC Managed Care – PPO | Admitting: Hematology

## 2021-04-09 ENCOUNTER — Other Ambulatory Visit: Payer: Self-pay

## 2021-04-09 VITALS — BP 148/79 | HR 89 | Temp 97.8°F | Resp 16 | Wt 159.0 lb

## 2021-04-09 DIAGNOSIS — K769 Liver disease, unspecified: Secondary | ICD-10-CM

## 2021-04-09 NOTE — Patient Instructions (Addendum)
[facility] Discharge Instructions  You were seen today by Dr. Katragadda. He went over your recent results. Dr. Katragadda will see you back in 3 months for labs and follow up.   Thank you for choosing [facility] at [facility] to provide your oncology and hematology care.  To afford each patient quality time with our provider, please arrive at least 15 minutes before your scheduled appointment time.   If you have a lab appointment with the Cancer Center please come in thru the Main Entrance and check in at the main information desk  You need to re-schedule your appointment should you arrive 10 or more minutes late.  We strive to give you quality time with our providers, and arriving late affects you and other patients whose appointments are after yours.  Also, if you no show three or more times for appointments you may be dismissed from the clinic at the providers discretion.     Again, thank you for choosing [facility].  Our hope is that these requests will decrease the amount of time that you wait before being seen by our physicians.       _____________________________________________________________  Should you have questions after your visit to [facility], please contact our office at [phone] between the hours of 8:00 a.m. and 4:30 p.m.  Voicemails left after 4:00 p.m. will not be returned until the following business day.  For prescription refill requests, have your pharmacy contact our office and allow 72 hours.    Cancer Center Support Programs:   > Cancer Support Group  2nd Tuesday of the month 1pm-2pm, Journey Room   

## 2021-04-20 ENCOUNTER — Telehealth: Payer: Self-pay | Admitting: *Deleted

## 2021-04-20 NOTE — Telephone Encounter (Signed)
Patient has been scheduled to see Dr Rush Landmark in follow up on 06/19/21 at 1010 am. She has also been made aware that she needs labs (ca 19-9, hepatic fx) the week preceding her office visit. Orders already in [place].  She does indicate that she is a bit worried as her bilirubin has increased again after being normal for several months. She states that she recently had 2 steroid shots for plantar fasciitis pain and thinks maybe this has something to do with the increase. She also notes that she is to have a dental crown placed in the near future and is scared to let the dentist use novocain as she has read via the Internet that "its isnt good for your liver."  Dr Rush Landmark- Do you need to see this patient in follow up sooner than currently scheduled appt on 06/19/21 due to the recent spike in bili or should she keep scheduled appt and lab visit in late October?

## 2021-04-20 NOTE — Telephone Encounter (Signed)
-----   Message from Larina Bras, [place] sent at 12/12/2020 10:04 AM EDT ----- Mansouraty patient--- Needs office follow up with mansouraty around 06/13/21 for abnormal mri/abnormal lfts'. Also needs labs 1 week prior to appt (orders already in epic). Make appt and let pt know. Remind her to have labs. See office note 12/12/20.

## 2021-04-21 NOTE — Telephone Encounter (Signed)
Patient has been informed of the following and will let us know if she develops any of the following. Pt will keep follow up appointment in Oct. Pt will have labs as directed.

## 2021-04-21 NOTE — Telephone Encounter (Signed)
I am okay with the currently scheduled date. She can have LFTs 1 month before the clinic visit as well and we will see where things go from there. If she notes overt jaundice, or persistent darkened urine, or generalized itching that does not go away, she should let us know before. Thanks. GM

## 2021-04-30 ENCOUNTER — Telehealth: Payer: Self-pay | Admitting: Gastroenterology

## 2021-04-30 NOTE — Telephone Encounter (Signed)
Pt is having some dental work done on [employer]  and they want to put her on Articaine 4% with Epinephrine. She wants to know if it is ok as she was told that she cannot take any medication before her liver enzymes are stable. Pls call her.

## 2021-04-30 NOTE — Telephone Encounter (Signed)
Dr Rush Landmark please review pt needs abx for dental work.  She is under the impression that she is to not take abx due to liver enzymes.

## 2021-04-30 NOTE — Telephone Encounter (Signed)
Patty, I have no contraindication for the patient to have antibiotics or other medications that are necessary (looks to be numbing medication) for her upcoming procedures. Thanks. GM

## 2021-05-01 NOTE — Telephone Encounter (Signed)
Left message on machine to call back  

## 2021-05-01 NOTE — Telephone Encounter (Signed)
The pt has been advised and will proceed with dental procedure as planned.

## 2021-05-11 ENCOUNTER — Telehealth: Payer: Self-pay | Admitting: Gastroenterology

## 2021-05-11 NOTE — Telephone Encounter (Signed)
Called pt to inquire further as it appears she is not scheduled for a liver biopsy. Pt states she is having a dermal lesion removed by her dermatologist and wondered if the epinephrine they will be injecting will cause any concerns. Advised, given the small amount of local required to "numb" is not concerning enough to cause concerns with her other medications. Per Dr. Rush Landmark, I have no contraindication for the patient to have antibiotics or other medications that are necessary (looks to be numbing medication) for her upcoming procedures. Pt expressed appreciation, acceptance and understanding of this information.

## 2021-05-11 NOTE — Telephone Encounter (Signed)
Inbound call from patient requesting a call from a nurse please.  States she will be having some type of procedure for her liver this week where they will be using epinephrine and wants to make sure it won't interfere with Dr. Ardis Hughs recommendation.  Please advise.

## 2021-05-14 ENCOUNTER — Encounter: Payer: Self-pay | Admitting: Dermatology

## 2021-05-14 ENCOUNTER — Ambulatory Visit (INDEPENDENT_AMBULATORY_CARE_PROVIDER_SITE_OTHER): Payer: BC Managed Care – PPO | Admitting: Dermatology

## 2021-05-14 ENCOUNTER — Other Ambulatory Visit: Payer: Self-pay

## 2021-05-14 DIAGNOSIS — D485 Neoplasm of uncertain behavior of skin: Secondary | ICD-10-CM

## 2021-05-14 DIAGNOSIS — C44329 Squamous cell carcinoma of skin of other parts of face: Secondary | ICD-10-CM | POA: Diagnosis not present

## 2021-05-14 NOTE — Patient Instructions (Signed)

## 2021-05-20 ENCOUNTER — Telehealth: Payer: Self-pay | Admitting: Dermatology

## 2021-05-20 NOTE — Telephone Encounter (Signed)
Explain results from my chart please

## 2021-05-21 NOTE — Telephone Encounter (Signed)
Not reviewed by [facility] yet patient aware

## 2021-05-21 NOTE — Telephone Encounter (Signed)
Release:  [id number]    Path to patient mohs referral sent

## 2021-05-29 ENCOUNTER — Telehealth: Payer: Self-pay | Admitting: Nurse Practitioner

## 2021-05-29 MED ORDER — SCOPOLAMINE 1 MG/3DAYS TD PT72: 1.0000 | MEDICATED_PATCH | TRANSDERMAL | 0 refills | Status: DC

## 2021-05-29 NOTE — Telephone Encounter (Signed)
Please advise 

## 2021-05-29 NOTE — Telephone Encounter (Signed)
Patient aware.

## 2021-05-29 NOTE — Telephone Encounter (Signed)
Sent to [facility]

## 2021-05-29 NOTE — Telephone Encounter (Signed)
  Prescription Request  05/29/2021  Is this a "Controlled Substance" medicine? no Have you seen your PCP in the last 2 weeks? no If YES, route message to pool  -  If NO, patient needs to be scheduled for appointment.  What is the name of the medication or equipment? Transdernscop 1.5 / pt is going on cruise Sunday  Have you contacted your pharmacy to request a refill?   [facility]   Patient notified that their request is being sent to the clinical staff for review and that they should receive a response within 2 business days.

## 2021-05-30 ENCOUNTER — Encounter: Payer: Self-pay | Admitting: Dermatology

## 2021-05-30 NOTE — Progress Notes (Signed)
   Follow-Up Visit   Subjective  Amber Winters is a 65 y.o. female who presents for the following: Skin Problem (New lesion right upper lip x 2 weeks ago- "growing").  Growth above right upper lip Location:  Duration:  Quality:  Associated Signs/Symptoms: Modifying Factors:  Severity:  Timing: Context:   Objective  Well appearing patient in no apparent distress; mood and affect are within normal limits. Right Melolabial Fold Pearly telangiectatic 6 mm papule typical of BCC         A focused examination was performed including head and neck.. Relevant physical exam findings are noted in the Assessment and Plan.   Assessment & Plan    Neoplasm of uncertain behavior of skin Right Melolabial Fold  Skin / nail biopsy Type of biopsy: tangential   Informed consent: discussed and consent obtained   Timeout: patient name, date of birth, surgical site, and procedure verified   Procedure prep:  Patient was prepped and draped in usual sterile fashion (Non sterile) Prep type:  Chlorhexidine Anesthesia: the lesion was anesthetized in a standard fashion   Anesthetic:  1% lidocaine w/ epinephrine 1-100,000 local infiltration Instrument used: flexible razor blade   Outcome: patient tolerated procedure well   Post-procedure details: wound care instructions given    Specimen 1 - Surgical pathology Differential Diagnosis: bcc vs scc  Check Margins: No  Shave biopsy.  May recommend Mohs surgery for optimal cosmesis.  This was already discussed with patient.      I, Lavonna Monarch, MD, have reviewed all documentation for this visit.  The documentation on 05/30/21 for the exam, diagnosis, procedures, and orders are all accurate and complete.

## 2021-06-11 ENCOUNTER — Telehealth: Payer: Self-pay | Admitting: Gastroenterology

## 2021-06-11 NOTE — Telephone Encounter (Signed)
The pt has been advised that she does not need any labs prior to her upcoming appt.

## 2021-06-11 NOTE — Telephone Encounter (Signed)
Patient wants to know if she should have bloodwork done prior to her visit with Dr. Rush Landmark next week.  Please call and advise.

## 2021-06-19 ENCOUNTER — Other Ambulatory Visit (INDEPENDENT_AMBULATORY_CARE_PROVIDER_SITE_OTHER): Payer: BC Managed Care – PPO

## 2021-06-19 ENCOUNTER — Encounter: Payer: Self-pay | Admitting: Gastroenterology

## 2021-06-19 ENCOUNTER — Ambulatory Visit (INDEPENDENT_AMBULATORY_CARE_PROVIDER_SITE_OTHER): Payer: BC Managed Care – PPO | Admitting: Gastroenterology

## 2021-06-19 VITALS — BP 138/88 | HR 93 | Ht 58.5 in | Wt 159.0 lb

## 2021-06-19 DIAGNOSIS — K838 Other specified diseases of biliary tract: Secondary | ICD-10-CM | POA: Diagnosis not present

## 2021-06-19 DIAGNOSIS — R933 Abnormal findings on diagnostic imaging of other parts of digestive tract: Secondary | ICD-10-CM

## 2021-06-19 DIAGNOSIS — R978 Other abnormal tumor markers: Secondary | ICD-10-CM | POA: Diagnosis not present

## 2021-06-19 DIAGNOSIS — R932 Abnormal findings on diagnostic imaging of liver and biliary tract: Secondary | ICD-10-CM

## 2021-06-19 LAB — HEPATIC FUNCTION PANEL
ALT: 16 U/L (ref 0–35)
AST: 20 U/L (ref 0–37)
Albumin: 4.5 g/dL (ref 3.5–5.2)
Alkaline Phosphatase: 73 U/L (ref 39–117)
Bilirubin, Direct: 0.1 mg/dL (ref 0.0–0.3)
Total Bilirubin: 1 mg/dL (ref 0.2–1.2)
Total Protein: 7.8 g/dL (ref 6.0–8.3)

## 2021-06-19 NOTE — Patient Instructions (Signed)
Your provider has requested that you go to the basement level for lab work before leaving today. Press "B" on the elevator. The lab is located at the first door on the left as you exit the elevator.  If you are age 65 or older, your body mass index should be between 23-30. Your Body mass index is 32.67 kg/m. If this is out of the aforementioned range listed, please consider follow up with your Primary Care Provider.  If you are age 59 or younger, your body mass index should be between 19-25. Your Body mass index is 32.67 kg/m. If this is out of the aformentioned range listed, please consider follow up with your Primary Care Provider.   ________________________________________________________  The [facility] providers would like to encourage you to use [facility] to communicate with providers for non-urgent requests or questions.  Due to long hold times on the telephone, sending your provider a message by [facility] may be a faster and more efficient way to get a response.  Please allow 48 business hours for a response.  Please remember that this is for non-urgent requests.  _______________________________________________________  Due to recent changes in healthcare laws, you may see the results of your imaging and laboratory studies on MyChart before your provider has had a chance to review them.  We understand that in some cases there may be results that are confusing or concerning to you. Not all laboratory results come back in the same time frame and the provider may be waiting for multiple results in order to interpret others.  Please give [place] 48 hours in order for your provider to thoroughly review all the results before contacting the office for clarification of your results.

## 2021-06-19 NOTE — Progress Notes (Signed)
[facility] VISIT   Primary Care Provider [address], [place] [address] [phone]  Patient Profile: Amber Winters is a 65 y.o. 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  The patient presents to the [facility] for an evaluation and management of problem(s) noted below:  Problem List 1. Elevated CA 19-9 level   2. Abnormal colonoscopy   3. Intrahepatic bile duct dilation      History of Present Illness Please see prior notes for full details of HPI.   Interval History Today, the patient returns for scheduled follow-up.  She continues to do well.  There was a slight elevation in bilirubin a few months ago but no direct bilirubin was obtained and she otherwise had no elevation in her alkaline phosphatase and was doing well.  She is planned to see Dr. Delton Coombes next month.  The patient denies any issues with jaundice, scleral icterus, generalized pruritus, darkened/amber urine, clay colored stools, lower extremity edema, hematemesis, coffee-ground emesis, abdominal distention, confusion.  No new abdominal pains or discomfort.  No changes in her bowel habits.  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  GI Review of Systems Positive as above Negative for dysphagia, odynophagia, nausea, vomiting, melena, hematochezia  Review of Systems General: Denies fevers/chills/weight loss unintentionally Cardiovascular: Denies chest pain/palpitations Pulmonary: Denies shortness of breath Gastroenterological: See HPI Genitourinary: Denies darkened  urine Hematological: Denies easy bruising/bleeding Dermatological: Denies jaundice Psychological: Mood is stable   Medications Current Outpatient Medications  Medication Sig Dispense Refill   Ascorbic Acid (VITAMIN C) 100 MG tablet Take 100 mg by mouth daily.     cephALEXin (KEFLEX) 250 MG capsule Take 250 mg by mouth daily.     cyanocobalamin 100 MCG tablet Take 100 mcg by mouth daily.     famotidine (PEPCID) 10 MG tablet Take 10 mg by mouth daily.     MAGNESIUM PO Take 1 tablet by mouth daily.     Multiple Vitamins-Minerals (ZINC PO) Take by mouth.     NON FORMULARY Take 1 tablet by mouth daily. Liver health     scopolamine (TRANSDERM-SCOP) 1 MG/3DAYS Place 1 patch (1.5 mg total) onto the skin every 3 (three) days. 4 patch 0   VITAMIN D PO Take 1 tablet by mouth daily.     vitamin E 1000 UNIT capsule Take 1,000 Units by mouth daily.     No current facility-administered medications for this visit.    Allergies Allergies  Allergen Reactions   Ciprofloxacin Other (See Comments)    Patient states this causes severe muscle aching and discomfort.   Nitrofurantoin Other (See Comments)    ILD   Asa [Aspirin]     Tinnitis   Sulfa Antibiotics Nausea And Vomiting   Zithromax [Azithromycin]     Abdominal cramps    Histories Past Medical History:  Diagnosis Date   Hyperlipidemia    Past Surgical History:  Procedure Laterality Date   ABDOMINAL HYSTERECTOMY  2000   BRONCHIAL WASHINGS  11/12/2019   Procedure: BRONCHIAL WASHINGS;  Surgeon: Brand Males, MD;  Location: [facility];  Service: Endoscopy;;   CATARACT EXTRACTION, BILATERAL     CHOLECYSTECTOMY  2001  HERNIA REPAIR     URETHRAL SLING     VIDEO BRONCHOSCOPY  11/12/2019   Procedure: VIDEO BRONCHOSCOPY WITH FLUORO;  Surgeon: Brand Males, MD;  Location: [facility];  Service: Endoscopy;;   Social History   Socioeconomic History   Marital status: Married    Spouse name: Not on file   Number of children: 2    Years of education: Not on file   Highest education level: Not on file  Occupational History   Not on file  Tobacco Use   Smoking status: Never   Smokeless tobacco: Never  Vaping Use   Vaping Use: Never used  Substance and Sexual Activity   Alcohol use: No   Drug use: No   Sexual activity: Not Currently  Other Topics Concern   Not on file  Social History Narrative   Not on file   Social Determinants of Health   Financial Resource Strain: Not on file  Food Insecurity: Not on file  Transportation Needs: Not on file  Physical Activity: Not on file  Stress: Not on file  Social Connections: Not on file  Intimate Partner Violence: Not on file   Family History  Problem Relation Age of Onset   Ovarian cancer Mother    Anuerysm Father    Hypertension Brother    Healthy Daughter    Healthy Daughter    Colon cancer Neg Hx    Esophageal cancer Neg Hx    Inflammatory bowel disease Neg Hx    Liver disease Neg Hx    Pancreatic cancer Neg Hx    Rectal cancer Neg Hx    Stomach cancer Neg Hx    I have reviewed her medical, social, and family history in detail and updated the electronic medical record as necessary.    PHYSICAL EXAMINATION  BP 138/88   Pulse 93   Ht 4' 10.5" (1.486 m)   Wt 159 lb (72.1 kg)   SpO2 98%   BMI 32.67 kg/m  Wt Readings from Last 3 Encounters:  06/19/21 159 lb (72.1 kg)  04/09/21 159 lb (72.1 kg)  12/12/20 159 lb 9.6 oz (72.4 kg)  GEN: NAD, appears stated age, doesn't appear chronically ill PSYCH: Cooperative, without pressured speech EYE: Conjunctivae pink, sclerae anicteric ENT: MMM CV: Nontachycardic RESP: No audible wheezing GI: NABS, soft, NT/ND, without rebound MSK/EXT: No lower extremity edema SKIN: No jaundice, no spider angiomata NEURO:  Alert & Oriented x 3, no focal deficits, no evidence of asterixis   REVIEW OF DATA  I reviewed the following data at the time of this encounter:  GI Procedures and Studies  Previously  reviewed  Laboratory Studies  Reviewed those in epic and care everywhere  Imaging Studies  We reviewed the October 2021 MRI/MRCP IMPRESSION: Diffuse atrophy of the left hepatic lobe with intrahepatic biliary ductal dilatation. These findings remain stable, and no focal hepatic mass identified. Consider ERCP for further evaluation. Large hiatal hernia.   ASSESSMENT  Ms. Collyer is a 65 y.o. 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  The patient is seen today for evaluation and management of:  1. Elevated CA 19-9 level   2. Abnormal colonoscopy   3. Intrahepatic bile duct dilation    The patient is hemodynamically and clinically stable.  We will recheck her hepatic function panel as well as  CA 19-9.  The plan was to reevaluate the region of the biliary tree with a MRI/MRCP at a 1 year mark so that is just around the corner.  I expect that her LFTs will be normal.  We will see the CA 19-9.  Oncology will order an MRI/MRCP as necessary and if things are stable we may push out her follow-up to further than every 6 months.  All patient questions were answered to the best of my ability, and the patient agrees to the aforementioned plan of action with follow-up as indicated.   PLAN  HFP and CA 19-9 to be obtained today Only if these are significantly elevated we may consider earlier MRI/MRCP Holding on ERCP at this time Follow-up with oncology as already scheduled with potential repeat MRI/MRCP near that time Colonoscopy for colon cancer screening due in 2029   Orders Placed This Encounter  Procedures   Cancer antigen 19-9   Hepatic function panel     New Prescriptions   No medications on file   Modified Medications   No medications on file    Planned  Follow Up No follow-ups on file.   Total Time in Face-to-Face and in Coordination of Care for patient including independent/personal interpretation/review of prior testing, medical history, examination, medication adjustment, communicating results with the patient directly, and documentation with the EHR is 20 minutes.   Justice Britain, MD [facility] Office # [phone]

## 2021-06-22 LAB — CANCER ANTIGEN 19-9: CA 19-9: 24 U/mL (ref <34)

## 2021-07-07 DIAGNOSIS — C4492 Squamous cell carcinoma of skin, unspecified: Secondary | ICD-10-CM

## 2021-07-07 HISTORY — DX: Squamous cell carcinoma of skin, unspecified: C44.92

## 2021-07-10 ENCOUNTER — Inpatient Hospital Stay (HOSPITAL_COMMUNITY): Payer: BC Managed Care – PPO

## 2021-07-20 ENCOUNTER — Ambulatory Visit (HOSPITAL_COMMUNITY): Payer: BC Managed Care – PPO | Admitting: Hematology

## 2021-08-06 ENCOUNTER — Ambulatory Visit (INDEPENDENT_AMBULATORY_CARE_PROVIDER_SITE_OTHER): Payer: Medicare Other

## 2021-08-06 ENCOUNTER — Ambulatory Visit: Payer: Medicare Other | Admitting: Podiatry

## 2021-08-06 ENCOUNTER — Other Ambulatory Visit: Payer: Self-pay

## 2021-08-06 DIAGNOSIS — M84374A Stress fracture, right foot, initial encounter for fracture: Secondary | ICD-10-CM

## 2021-08-06 DIAGNOSIS — M722 Plantar fascial fibromatosis: Secondary | ICD-10-CM | POA: Diagnosis not present

## 2021-08-09 NOTE — Progress Notes (Signed)
Subjective:   Patient ID: Amber Winters, female   DOB: 65 y.o.   MRN: [id number]   HPI 65 year old female presents the office today with concerns of right foot pain, possible fracture.  She states that she has a history of Plantar fasciitis and she previously had seen Dr. Gershon Mussel.  She states that she is previous had injections into her feet for Plantar fasciitis.  She states that she start developing pain on the right foot points along the metatarsal areas as well as the lateral aspect of foot.  She went to the cam boot over the last couple days that she had at home and has been helpful.  No specific injury that she reports.  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   Review of Systems  All other systems reviewed and are negative.  Past Medical History:  Diagnosis Date   Hyperlipidemia     Past Surgical History:  Procedure Laterality Date   ABDOMINAL HYSTERECTOMY  2000   BRONCHIAL WASHINGS  11/12/2019   Procedure: BRONCHIAL WASHINGS;  Surgeon: Brand Males, MD;  Location: [facility];  Service: Endoscopy;;   CATARACT EXTRACTION, BILATERAL     CHOLECYSTECTOMY  2001   HERNIA REPAIR     URETHRAL SLING     VIDEO BRONCHOSCOPY  11/12/2019   Procedure: VIDEO BRONCHOSCOPY WITH FLUORO;  Surgeon: Brand Males, MD;  Location: [facility];  Service: Endoscopy;;     Current Outpatient Medications:    Ascorbic Acid (VITAMIN C) 100 MG tablet, Take 100 mg by mouth daily., Disp: , Rfl:    cefdinir (OMNICEF) 300 MG capsule, Take 300 mg by mouth 2 (two) times daily., Disp: , Rfl:    cephALEXin (KEFLEX) 250 MG capsule, Take 250 mg by mouth daily., Disp: , Rfl:    cyanocobalamin 100 MCG tablet, Take 100 mcg by mouth daily., Disp: , Rfl:    famotidine (PEPCID) 10 MG tablet, Take 10 mg by mouth daily., Disp: , Rfl:    MAGNESIUM PO, Take 1 tablet by mouth daily., Disp: , Rfl:    Multiple Vitamins-Minerals (ZINC PO), Take by mouth., Disp: , Rfl:    NON FORMULARY,  Take 1 tablet by mouth daily. Liver health, Disp: , Rfl:    scopolamine (TRANSDERM-SCOP) 1 MG/3DAYS, Place 1 patch (1.5 mg total) onto the skin every 3 (three) days., Disp: 4 patch, Rfl: 0   VITAMIN D PO, Take 1 tablet by mouth daily., Disp: , Rfl:    vitamin E 1000 UNIT capsule, Take 1,000 Units by mouth daily., Disp: , Rfl:   Allergies  Allergen Reactions   Ciprofloxacin Other (See Comments)    Patient states this causes severe muscle aching and discomfort.   Nitrofurantoin Other (See Comments)    ILD   Asa [Aspirin]     Tinnitis   Sulfa Antibiotics Nausea And Vomiting   Zithromax [Azithromycin]     Abdominal cramps         Objective:  Physical Exam  General: AAO x3, NAD-presents wearing cam boot right side  Dermatological: Skin is warm, dry and supple bilateral.  There are no open sores, no preulcerative lesions, no rash or signs of infection present.  Vascular: Dorsalis Pedis artery and Posterior Tibial artery pedal pulses are 2/4 bilateral with immedate capillary fill time.  There is no pain with calf compression, swelling, warmth, erythema.   Neruologic: Grossly intact via light touch bilateral.  Negative Tinel sign.  Musculoskeletal: On  today's exam there is no significant discomfort on the course or insertion of plantar fascial on the right side for the majority of tenderness or swelling of the third and fourth metatarsals dorsally.  There is faint edema but there is no erythema or warmth.  Flexor, extensor tendons appear to be intact.  Muscular strength 5/5 in all groups tested bilateral.  Gait: Unassisted, Nonantalgic.       Assessment:   History plantar fasciitis with concern for stress fracture right foot     Plan:  -Treatment options discussed including all alternatives, risks, and complications -Etiology of symptoms were discussed -X-rays obtained reviewed.  No definitive acute fracture identified today. -At this point given her symptoms on her remain in  the cam boot.  Continue to ice and elevate as well.  Unfortunate unable to tolerate oral anti-inflammatories or steroids on a regular basis.  Discussed topical medications that she can try.  Trula Slade DPM

## 2021-09-01 ENCOUNTER — Ambulatory Visit: Payer: Medicare Other | Admitting: Podiatry

## 2021-09-01 ENCOUNTER — Other Ambulatory Visit: Payer: Self-pay

## 2021-09-01 ENCOUNTER — Ambulatory Visit (INDEPENDENT_AMBULATORY_CARE_PROVIDER_SITE_OTHER): Payer: Medicare Other

## 2021-09-01 DIAGNOSIS — M84374A Stress fracture, right foot, initial encounter for fracture: Secondary | ICD-10-CM

## 2021-09-01 DIAGNOSIS — M84374D Stress fracture, right foot, subsequent encounter for fracture with routine healing: Secondary | ICD-10-CM

## 2021-09-01 DIAGNOSIS — M722 Plantar fascial fibromatosis: Secondary | ICD-10-CM

## 2021-09-01 NOTE — Progress Notes (Signed)
SITUATION Reason for Consult: Follow-up with functional foot orthotics Patient / Caregiver Report: Patient needs her existing foot orthotics modified.  OBJECTIVE DATA History / Diagnosis:    ICD-10-CM   1. Stress fracture of metatarsal bone of right foot, initial encounter  M84.374A       Change in Pathology: None  ACTIONS PERFORMED Patient's equipment was checked for structural stability and fit. Took most recent pair for modification and refurbishment. Scaphoid pad to be removed, topcoat to be replaced with 1.5/1.5 pelite and ppt. Device(s) intact and fit is excellent. All questions answered and concerns addressed.  PLAN Patient to return for pickup in 4 weeks. Plan of care discussed with and agreed upon by patient / caregiver.

## 2021-09-06 DIAGNOSIS — M722 Plantar fascial fibromatosis: Secondary | ICD-10-CM | POA: Insufficient documentation

## 2021-09-06 NOTE — Progress Notes (Signed)
Subjective: 66 year old female presents the office today for follow-up of a ration of right foot pain, possible stress fracture.  She states that she is doing the cam boot she is been doing stretching exercises and overall she is doing substantially better.  She does bring her orthotics that she had made from another provider.  Her last pair of orthotics were not comfortable at all but the older ones are comfortable but are worn out.  She is asking about getting back into regular shoe.  No increase in swelling.  No recent injuries or changes otherwise since her last saw her she has no other concerns today.  Objective: AAO x3, NAD DP/PT pulses palpable bilaterally, CRT less than 3 seconds On today's exam unable to elicit any area of pinpoint tenderness.  Particular there is no pain along the course or insertion of plantar fascial today there is no pain on the metatarsals.  No significant edema.  Flexor, extensor tendons appear to be intact.  MMT 5/5. No pain with calf compression, swelling, warmth, erythema  Assessment: Right foot pain, Plantar fasciitis  Plan: -All treatment options discussed with the patient including all alternatives, risks, complications.  -X-rays obtained reviewed.  There is no evidence of acute fracture or stress fracture noted today. -As the pain is much improved discussed with her she can start to transition to back into regular shoe as tolerated.  Discussed gradual transition.  I had her follow-up with our orthotist, Aaron Edelman, for inserts today.  Continue stretching, icing daily as well. -Patient encouraged to call the office with any questions, concerns, change in symptoms.   Trula Slade DPM

## 2021-09-17 ENCOUNTER — Other Ambulatory Visit (HOSPITAL_COMMUNITY): Payer: Self-pay

## 2021-09-17 DIAGNOSIS — R160 Hepatomegaly, not elsewhere classified: Secondary | ICD-10-CM

## 2021-09-17 DIAGNOSIS — K769 Liver disease, unspecified: Secondary | ICD-10-CM

## 2021-09-17 NOTE — Progress Notes (Signed)
Ordered per inbasket message from Dr. Delton Coombes- Please order MRI/MRCP to be done prior to next visit with me in February.

## 2021-09-23 ENCOUNTER — Other Ambulatory Visit: Payer: Self-pay

## 2021-09-23 ENCOUNTER — Ambulatory Visit: Payer: Medicare Other

## 2021-09-23 DIAGNOSIS — M722 Plantar fascial fibromatosis: Secondary | ICD-10-CM

## 2021-09-23 DIAGNOSIS — M84374D Stress fracture, right foot, subsequent encounter for fracture with routine healing: Secondary | ICD-10-CM

## 2021-09-23 NOTE — Progress Notes (Signed)
SITUATION: Reason for Visit: Fitting and Delivery of Custom Fabricated Foot Orthoses Patient Report: Patient reports comfort and is satisfied with device.  OBJECTIVE DATA: Patient History / Diagnosis:     ICD-10-CM   1. Plantar fasciitis, right  M72.2     2. Stress fracture of metatarsal bone of right foot with routine healing, subsequent encounter  M84.374D       Provided Device:  Custom Functional Foot Orthotics     [facility]: [id number] - Refurbish   GOAL OF ORTHOSIS - Improve gait - Decrease energy expenditure - Improve Balance - Provide Triplanar stability of foot complex - Facilitate motion  ACTIONS PERFORMED Patient was fit with foot orthotics trimmed to shoe last. Patient tolerated fittign procedure.   Patient was provided with verbal and written instruction and demonstration regarding donning, doffing, wear, care, proper fit, function, purpose, cleaning, and use of the orthosis and in all related precautions and risks and benefits regarding the orthosis.  Patient was also provided with verbal instruction regarding how to report any failures or malfunctions of the orthosis and necessary follow up care. Patient was also instructed to contact our office regarding any change in status that may affect the function of the orthosis.  Patient demonstrated independence with proper donning, doffing, and fit and verbalized understanding of all instructions.  PLAN: Patient is to follow up in one week or as necessary (PRN). All questions were answered and concerns addressed. Plan of care was discussed with and agreed upon by the patient. f

## 2021-10-01 LAB — HM MAMMOGRAPHY: HM Mammogram: NORMAL (ref 0–4)

## 2021-10-02 ENCOUNTER — Other Ambulatory Visit: Payer: Self-pay

## 2021-10-02 ENCOUNTER — Inpatient Hospital Stay (HOSPITAL_COMMUNITY): Payer: Medicare Other | Attending: Hematology

## 2021-10-02 ENCOUNTER — Ambulatory Visit (HOSPITAL_COMMUNITY)
Admission: RE | Admit: 2021-10-02 | Discharge: 2021-10-02 | Disposition: A | Discharge status: Home / Self Care | Payer: Medicare Other | Source: Ambulatory Visit | Attending: Hematology | Admitting: Hematology

## 2021-10-02 ENCOUNTER — Other Ambulatory Visit (HOSPITAL_COMMUNITY): Payer: Self-pay | Admitting: Hematology

## 2021-10-02 DIAGNOSIS — R160 Hepatomegaly, not elsewhere classified: Secondary | ICD-10-CM | POA: Insufficient documentation

## 2021-10-02 DIAGNOSIS — K769 Liver disease, unspecified: Secondary | ICD-10-CM | POA: Diagnosis present

## 2021-10-02 DIAGNOSIS — R978 Other abnormal tumor markers: Secondary | ICD-10-CM | POA: Diagnosis not present

## 2021-10-02 DIAGNOSIS — Z8041 Family history of malignant neoplasm of ovary: Secondary | ICD-10-CM | POA: Insufficient documentation

## 2021-10-02 DIAGNOSIS — K449 Diaphragmatic hernia without obstruction or gangrene: Secondary | ICD-10-CM | POA: Diagnosis not present

## 2021-10-02 DIAGNOSIS — Z8042 Family history of malignant neoplasm of prostate: Secondary | ICD-10-CM | POA: Insufficient documentation

## 2021-10-02 LAB — HEPATIC FUNCTION PANEL
ALT: 18 U/L (ref 0–44)
AST: 25 U/L (ref 15–41)
Albumin: 4.2 g/dL (ref 3.5–5.0)
Alkaline Phosphatase: 68 U/L (ref 38–126)
Bilirubin, Direct: 0.1 mg/dL (ref 0.0–0.2)
Indirect Bilirubin: 1 mg/dL — ABNORMAL HIGH (ref 0.3–0.9)
Total Bilirubin: 1.1 mg/dL (ref 0.3–1.2)
Total Protein: 7.4 g/dL (ref 6.5–8.1)

## 2021-10-02 MED ORDER — GADOBUTROL 1 MMOL/ML IV SOLN
7.0000 mL | Freq: Once | INTRAVENOUS | Status: AC | PRN
  Administered 2021-10-02: 7 mL via INTRAVENOUS

## 2021-10-03 LAB — CANCER ANTIGEN 19-9: CA 19-9: 34 U/mL (ref 0–35)

## 2021-10-09 ENCOUNTER — Other Ambulatory Visit (HOSPITAL_COMMUNITY): Payer: Self-pay

## 2021-10-12 ENCOUNTER — Ambulatory Visit: Payer: Medicare Other | Admitting: Dermatology

## 2021-10-12 ENCOUNTER — Encounter: Payer: Self-pay | Admitting: Dermatology

## 2021-10-12 ENCOUNTER — Other Ambulatory Visit: Payer: Self-pay

## 2021-10-12 DIAGNOSIS — Z1283 Encounter for screening for malignant neoplasm of skin: Secondary | ICD-10-CM | POA: Diagnosis not present

## 2021-10-12 DIAGNOSIS — L729 Follicular cyst of the skin and subcutaneous tissue, unspecified: Secondary | ICD-10-CM

## 2021-10-12 DIAGNOSIS — D1801 Hemangioma of skin and subcutaneous tissue: Secondary | ICD-10-CM | POA: Diagnosis not present

## 2021-10-12 DIAGNOSIS — L821 Other seborrheic keratosis: Secondary | ICD-10-CM

## 2021-10-12 DIAGNOSIS — Z85828 Personal history of other malignant neoplasm of skin: Secondary | ICD-10-CM

## 2021-10-12 DIAGNOSIS — Z8589 Personal history of malignant neoplasm of other organs and systems: Secondary | ICD-10-CM

## 2021-10-13 ENCOUNTER — Inpatient Hospital Stay (HOSPITAL_COMMUNITY): Payer: Medicare Other | Admitting: Hematology

## 2021-10-13 VITALS — BP 147/88 | HR 86 | Temp 98.4°F | Resp 18 | Ht 58.27 in | Wt 155.9 lb

## 2021-10-13 DIAGNOSIS — K769 Liver disease, unspecified: Secondary | ICD-10-CM | POA: Diagnosis not present

## 2021-10-13 NOTE — Progress Notes (Signed)
[address] [place], [place] [address]   CLINIC:  Medical Oncology/Hematology  PCP:  Chevis Pretty, [place] / [place] [address] [phone]   REASON FOR VISIT:  Follow-up for mass of the left lobe of the liver  PRIOR THERAPY: none  NGS Results: not done  CURRENT THERAPY: surveillance  BRIEF ONCOLOGIC HISTORY:  Oncology History   No history exists.    CANCER STAGING:  Cancer Staging  No matching staging information was found for the patient.  INTERVAL HISTORY:  Ms. DARLINA MCCAUGHEY, a 66 y.o. female, returns for routine follow-up of her mass of the left lobe of the liver. Ashlea was last seen on 04/09/2021.   Today she reports feeling good. She denies abdominal pain, and her appetite is good. She denies n/v/d.    REVIEW OF SYSTEMS:  Review of Systems  Constitutional:  Negative for appetite change and fatigue.  Gastrointestinal:  Negative for abdominal pain, diarrhea, nausea and vomiting.  All other systems reviewed and are negative.  PAST MEDICAL/SURGICAL HISTORY:  Past Medical History:  Diagnosis Date   Hyperlipidemia    Squamous cell carcinoma of skin 07/07/2021   MOHs was done by Dr. [facility]ifred [facility] - [facility]   Past Surgical History:  Procedure Laterality Date   ABDOMINAL HYSTERECTOMY  2000   BRONCHIAL WASHINGS  11/12/2019   Procedure: BRONCHIAL WASHINGS;  Surgeon: Brand Males, MD;  Location: [facility];  Service: Endoscopy;;   CATARACT EXTRACTION, BILATERAL     CHOLECYSTECTOMY  2001   HERNIA REPAIR     URETHRAL SLING     VIDEO BRONCHOSCOPY  11/12/2019   Procedure: VIDEO BRONCHOSCOPY WITH FLUORO;  Surgeon: Brand Males, MD;  Location: [facility];  Service: Endoscopy;;    SOCIAL HISTORY:  Social History   Socioeconomic History   Marital status: Married    Spouse name: Not on file   Number of children: 2   Years of education: Not on file   Highest education level: Not on file   Occupational History   Not on file  Tobacco Use   Smoking status: Never   Smokeless tobacco: Never  Vaping Use   Vaping Use: Never used  Substance and Sexual Activity   Alcohol use: No   Drug use: No   Sexual activity: Not Currently  Other Topics Concern   Not on file  Social History Narrative   Not on file   Social Determinants of Health   Financial Resource Strain: Not on file  Food Insecurity: Not on file  Transportation Needs: Not on file  Physical Activity: Not on file  Stress: Not on file  Social Connections: Not on file  Intimate Partner Violence: Not on file    FAMILY HISTORY:  Family History  Problem Relation Age of Onset   Ovarian cancer Mother    Anuerysm Father    Hypertension Brother    Healthy Daughter    Healthy Daughter    Colon cancer Neg Hx    Esophageal cancer Neg Hx    Inflammatory bowel disease Neg Hx    Liver disease Neg Hx    Pancreatic cancer Neg Hx    Rectal cancer Neg Hx    Stomach cancer Neg Hx     CURRENT MEDICATIONS:  Current Outpatient Medications  Medication Sig Dispense Refill   Ascorbic Acid (VITAMIN C) 100 MG tablet Take 100 mg by mouth daily.     cephALEXin (KEFLEX) 250 MG capsule Take 250  mg by mouth daily.     cyanocobalamin 100 MCG tablet Take 100 mcg by mouth daily.     MAGNESIUM PO Take 1 tablet by mouth daily.     Multiple Vitamins-Minerals (ZINC PO) Take by mouth.     NON FORMULARY Take 1 tablet by mouth daily. Liver health     VITAMIN D PO Take 1 tablet by mouth daily.     vitamin E 1000 UNIT capsule Take 1,000 Units by mouth daily.     famotidine (PEPCID) 10 MG tablet Take 10 mg by mouth daily. (Patient not taking: Reported on 10/13/2021)     No current facility-administered medications for this visit.    ALLERGIES:  Allergies  Allergen Reactions   Ciprofloxacin Other (See Comments)    Patient states this causes severe muscle aching and discomfort.   Nitrofurantoin Other (See Comments)    ILD   Asa  [Aspirin]     Tinnitis   Sulfa Antibiotics Nausea And Vomiting   Zithromax [Azithromycin]     Abdominal cramps    PHYSICAL EXAM:  Performance status (ECOG): 0 - Asymptomatic  Vitals:   10/13/21 1157  BP: (!) 147/88  Pulse: 86  Resp: 18  Temp: 98.4 F (36.9 C)  SpO2: 100%   Wt Readings from Last 3 Encounters:  10/13/21 155 lb 13.8 oz (70.7 kg)  06/19/21 159 lb (72.1 kg)  04/09/21 159 lb (72.1 kg)   Physical Exam Vitals reviewed.  Constitutional:      Appearance: Normal appearance. She is obese.  Cardiovascular:     Rate and Rhythm: Normal rate and regular rhythm.     Pulses: Normal pulses.     Heart sounds: Normal heart sounds.  Pulmonary:     Effort: Pulmonary effort is normal.     Breath sounds: Normal breath sounds.  Abdominal:     Palpations: Abdomen is soft. There is no mass.     Tenderness: There is no abdominal tenderness.  Neurological:     General: No focal deficit present.     Mental Status: She is alert and oriented to person, place, and time.  Psychiatric:        Mood and Affect: Mood normal.        Behavior: Behavior normal.     LABORATORY DATA:  I have reviewed the labs as listed.  CBC Latest Ref Rng & Units 07/02/2020 06/12/2020 05/09/2020  WBC 4.0 - 10.5 K/uL 7.4 7.1 10.0  Hemoglobin 12.0 - 15.0 g/dL 13.7 13.7 14.6  Hematocrit 36.0 - 46.0 % 41.5 40.2 43.5  Platelets 150 - 400 K/uL 243 242.0 266   CMP Latest Ref Rng & Units 10/02/2021 06/19/2021 04/02/2021  Glucose 70 - 99 mg/dL - - -  BUN 8 - 23 mg/dL - - -  Creatinine 0.44 - 1.00 mg/dL - - -  Sodium 135 - 145 mmol/L - - -  Potassium 3.5 - 5.1 mmol/L - - -  Chloride 98 - 111 mmol/L - - -  CO2 22 - 32 mmol/L - - -  Calcium 8.9 - 10.3 mg/dL - - -  Total Protein 6.5 - 8.1 g/dL 7.4 7.8 7.7  Total Bilirubin 0.3 - 1.2 mg/dL 1.1 1.0 1.6(H)  Alkaline Phos 38 - 126 U/L 68 73 72  AST 15 - 41 U/L 25 20 23   ALT 0 - 44 U/L 18 16 18     DIAGNOSTIC IMAGING:  I have independently reviewed the scans  and discussed with the patient. MR 3D Recon  At Scanner  Result Date: 10/02/2021 CLINICAL DATA:  Follow-up hepatic abnormality in the left lobe EXAM: MRI ABDOMEN WITHOUT AND WITH CONTRAST (INCLUDING MRCP) TECHNIQUE: Multiplanar multisequence MR imaging of the abdomen was performed both before and after the administration of intravenous contrast. Heavily T2-weighted images of the biliary and pancreatic ducts were obtained, and three-dimensional MRCP images were rendered by post processing. CONTRAST:  8mL GADAVIST GADOBUTROL 1 MMOL/ML IV SOLN COMPARISON:  Multiple priors including MRI May 30, 2020, MRI April 22, 2020 and PET-CT May 08, 2020 FINDINGS: Lower chest: No acute abnormality.  Large hiatal hernia. Hepatobiliary: No significant hepatic steatosis. No significant interval change in the diffuse atrophy of the left hepatic lobe which again shows diffuse mild T2 hyperintensity and slight abnormally increased enhancement, but without focal mass lesion visualized. Prior cholecystectomy. Diffuse left hepatic lobe biliary ductal dilation, unchanged. Mild prominence of the extrahepatic biliary tree is favored reservoir effect post cholecystectomy. No right intrahepatic biliary ductal dilation. Pancreas: Intrinsic T1 signal of the pancreatic parenchyma is within normal limits. Homogeneous postcontrast enhancement of the pancreatic parenchyma. No pancreatic ductal dilation. No cystic or solid hyperenhancing pancreatic lesion. Spleen:  Within normal limits in size and appearance. Adrenals/Urinary Tract: No masses identified. No evidence of hydronephrosis. Stomach/Bowel: Large hiatal hernia. No pathologic dilation or evidence of acute inflammation involving loops of large or small bowel in the abdomen. Vascular/Lymphatic: No pathologically enlarged lymph nodes identified. No abdominal aortic aneurysm demonstrated. Other:  No abdominal ascites. Musculoskeletal: No suspicious bone lesions identified. IMPRESSION:  1. No significant interval change in the diffuse left hepatic lobe atrophy and associated intrahepatic biliary ductal dilation. No hepatic mass identified. 2. Large hiatal hernia. Electronically Signed   By: Dahlia Bailiff M.D.   On: 10/02/2021 10:41   MR ABDOMEN MRCP W WO CONTAST  Result Date: 10/02/2021 CLINICAL DATA:  Follow-up hepatic abnormality in the left lobe EXAM: MRI ABDOMEN WITHOUT AND WITH CONTRAST (INCLUDING MRCP) TECHNIQUE: Multiplanar multisequence MR imaging of the abdomen was performed both before and after the administration of intravenous contrast. Heavily T2-weighted images of the biliary and pancreatic ducts were obtained, and three-dimensional MRCP images were rendered by post processing. CONTRAST:  7mL GADAVIST GADOBUTROL 1 MMOL/ML IV SOLN COMPARISON:  Multiple priors including MRI May 30, 2020, MRI April 22, 2020 and PET-CT May 08, 2020 FINDINGS: Lower chest: No acute abnormality.  Large hiatal hernia. Hepatobiliary: No significant hepatic steatosis. No significant interval change in the diffuse atrophy of the left hepatic lobe which again shows diffuse mild T2 hyperintensity and slight abnormally increased enhancement, but without focal mass lesion visualized. Prior cholecystectomy. Diffuse left hepatic lobe biliary ductal dilation, unchanged. Mild prominence of the extrahepatic biliary tree is favored reservoir effect post cholecystectomy. No right intrahepatic biliary ductal dilation. Pancreas: Intrinsic T1 signal of the pancreatic parenchyma is within normal limits. Homogeneous postcontrast enhancement of the pancreatic parenchyma. No pancreatic ductal dilation. No cystic or solid hyperenhancing pancreatic lesion. Spleen:  Within normal limits in size and appearance. Adrenals/Urinary Tract: No masses identified. No evidence of hydronephrosis. Stomach/Bowel: Large hiatal hernia. No pathologic dilation or evidence of acute inflammation involving loops of large or small bowel  in the abdomen. Vascular/Lymphatic: No pathologically enlarged lymph nodes identified. No abdominal aortic aneurysm demonstrated. Other:  No abdominal ascites. Musculoskeletal: No suspicious bone lesions identified. IMPRESSION: 1. No significant interval change in the diffuse left hepatic lobe atrophy and associated intrahepatic biliary ductal dilation. No hepatic mass identified. 2. Large hiatal hernia. Electronically Signed   By: Dellis Filbert  Nance Pew M.D.   On: 10/02/2021 10:41     ASSESSMENT:  1.  Mass of the left lobe of the liver: -CT scan of the chest on 04/09/2020 showed mass in the left hepatic lobe with atrophy of the same lobe. -MRI of the abdomen on 04/22/2020 showed a new mass in the left liver lobe since July 2019, abnormal signal and enhancement with possible intrahepatic duct dilatation, suspicious for cholangiocarcinoma.  No abdominal adenopathy. -She denies any fevers, night sweats or weight loss. -LFTs on 04/21/2020 shows mildly elevated total bilirubin of 1.4. -PET scan on 05/08/2020 shows no hypermetabolic activity in the left hepatic lobe.  Large hiatal hernia. -MRCP on 05/30/2020 shows diffuse atrophy of the left hepatic lobe with intrahepatic biliary ductal dilatation with no focal hepatic mass identified. -CA 19-9 is elevated at 88.  AFP and CEA is normal. - She had gallbladder surgery that was done 20 years ago which could have contributed to the atrophy of the left hepatic lobe.   2.  Social/family history: -Works as a Print production planner.  Never smoker. -Mother had ovarian cancer in her 16s and father had prostate cancer.  Paternal grandfather had cirrhosis   PLAN:  1.  Intrahepatic biliary ductal dilatation: - She denies any nausea, vomiting or abdominal pain.  No dyspepsia symptoms. - We have reviewed labs from 10/02/2021 which showed normal LFTs.  CA 19-9 was normal at 34. - We have reviewed MRCP from 10/02/2021 which did not show any significant interval change in the diffuse  left hepatic lobe atrophy and associated intrahepatic biliary ductal dilatation.  No liver masses notified.  Large hiatal hernia is stable. - As her scan and labs are stable, I will follow her in 1 year. - I will also communicate this with Dr. Rush Landmark.   Orders placed this encounter:  No orders of the defined types were placed in this encounter.    Derek Jack, MD [place] [phone]   I, Thana Ates, am acting as a scribe for Dr. Derek Jack.  I, Derek Jack MD, have reviewed the above documentation for accuracy and completeness, and I agree with the above.

## 2021-10-14 ENCOUNTER — Encounter: Payer: Self-pay | Admitting: Dermatology

## 2021-10-14 ENCOUNTER — Other Ambulatory Visit (HOSPITAL_COMMUNITY): Payer: Self-pay | Admitting: *Deleted

## 2021-10-14 DIAGNOSIS — K769 Liver disease, unspecified: Secondary | ICD-10-CM

## 2021-10-14 NOTE — Progress Notes (Signed)
° °  Follow-Up Visit   Subjective  Amber Winters is a 66 y.o. female who presents for the following: Annual Exam (Pt here for annual. Pt has hx of SCC on the R Side of the nose, MOHs - 11/15 - Dr. Winifred Olive).  General skin examination, recent Mohs right cheek Location:  Duration:  Quality:  Associated Signs/Symptoms: Modifying Factors:  Severity:  Timing: Context:   Objective  Well appearing patient in no apparent distress; mood and affect are within normal limits. No atypical pigmented lesions.  No sign of nonmelanoma skin cancer.  Several brown flattopped textured 3 to 6 mm papules, typical dermoscopy  Multiple 1 mm smooth red dermal papules  Right Melolabial Fold Pt had MOHs 07/07/2021 - Dr. [facility] - [facility].  She discussed with Dr. Nicholos Johns the unevenness of the vermilion border of her right upper lip and he offered to correct this.  Right Buccal Cheek 4 mm white dermal noninflamed papule    A full examination was performed including scalp, head, eyes, ears, nose, lips, neck, chest, axillae, abdomen, back, buttocks, bilateral upper extremities, bilateral lower extremities, hands, feet, fingers, toes, fingernails, and toenails. All findings within normal limits unless otherwise noted below.  Areas beneath undergarments not fully examined.   Assessment & Plan    Screening exam for skin cancer  Annual skin examination.  Encouraged to check with husband twice annually.  Continue ultraviolet protection.  History of squamous cell carcinoma Right Melolabial Fold  No sign residual skin cancer.  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      I, Lavonna Monarch, MD, have reviewed all documentation for this visit.  The documentation on 10/14/21 for the exam, diagnosis, procedures, and orders are all  accurate and complete.

## 2021-10-20 ENCOUNTER — Ambulatory Visit: Payer: Medicare Other | Admitting: Podiatry

## 2022-03-24 IMAGING — PT NM PET TUM IMG INITIAL (PI) SKULL BASE T - THIGH
7 series · 25 of 25 positions shown · non-contrast
Comparison: Abdominal MRI 04/22/2020

CLINICAL DATA: Initial treatment strategy for hepatic biliary
cancer.

EXAM:
NUCLEAR MEDICINE PET SKULL BASE TO THIGH
TECHNIQUE: 7.4 mCi F-18 FDG was injected intravenously. Full-ring PET imaging
was performed from the skull base to thigh after the radiotracer. CT
data was obtained and used for attenuation correction and anatomic
localization.
Fasting blood glucose: 78 mg/dl

[Series 3: pet sk_thigh ac · axial · 5.0mm · 4.07mm/px · z∈[-889,-61]mm · 6 of 208 slices shown]
[im 1/208]
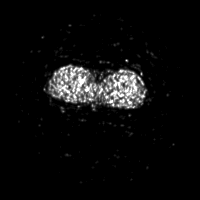
[im 42/208]
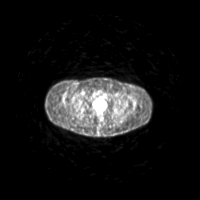
[im 83/208]
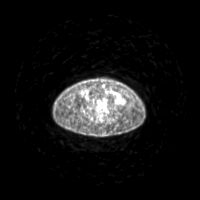
[im 125/208]
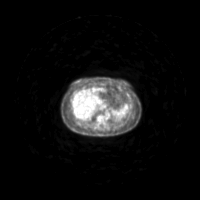
[im 166/208]
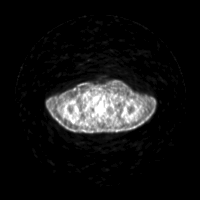
[im 208/208]
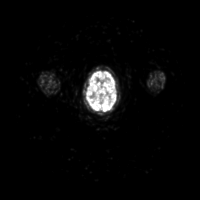

[Series 4: ct sk_thigh 5.0 b31f · axial · 5.0mm · 0.98mm/px · z∈[-889,-61]mm · 5 of 208 slices shown]
[im 1/208]
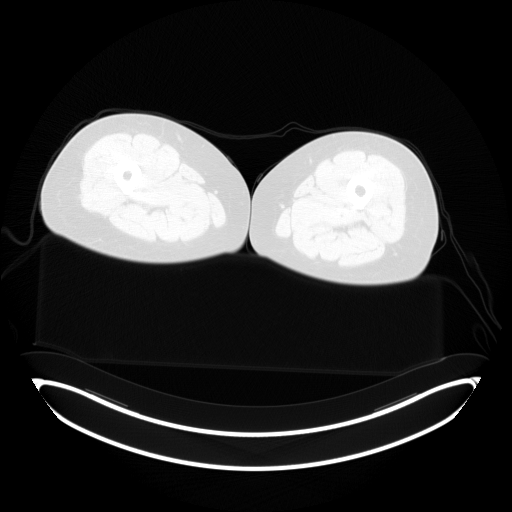
[im 52/208]
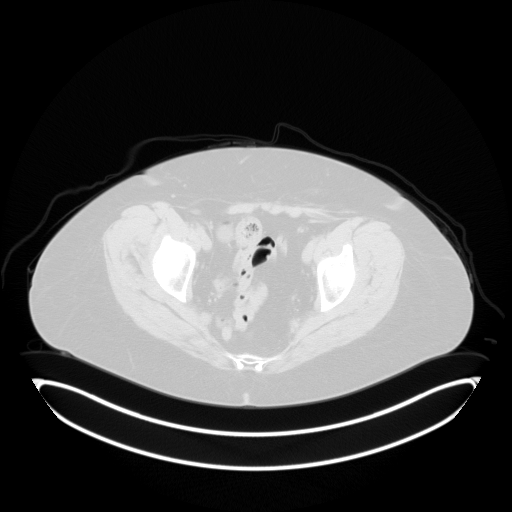
[im 104/208]
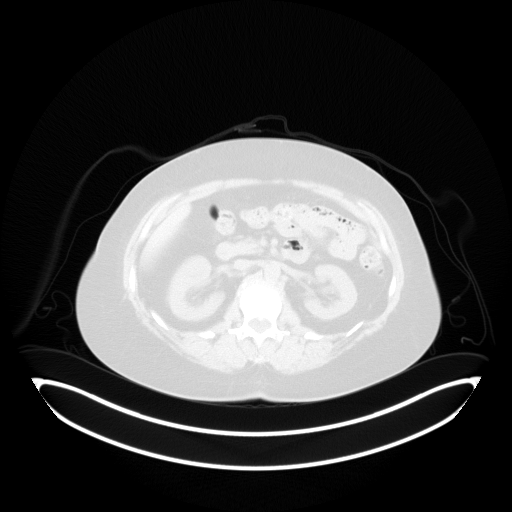
[im 156/208]
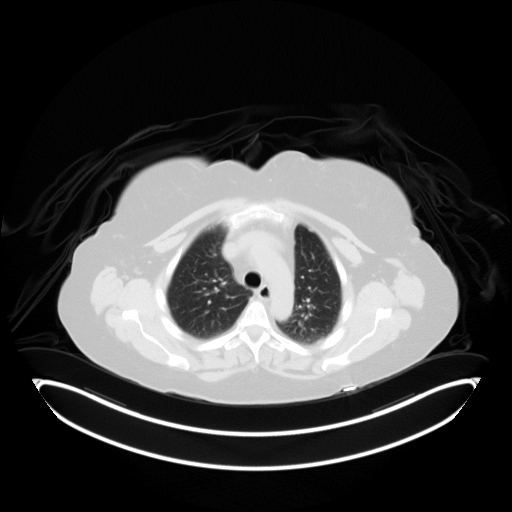
[im 208/208  brain]
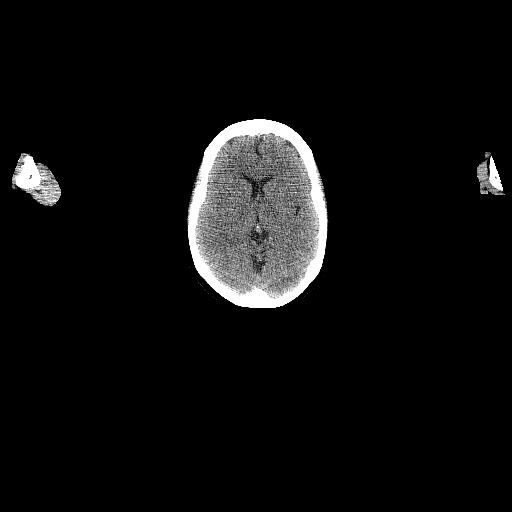

[Series 5: pet sk_thigh nac · axial · 5.0mm · 4.07mm/px · z∈[-889,-61]mm · 5 of 208 slices shown]
[im 1/208]
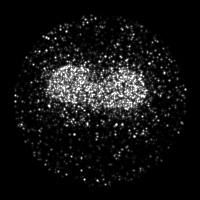
[im 52/208]
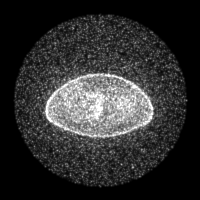
[im 104/208]
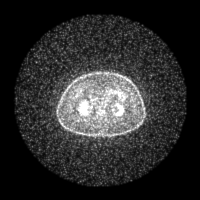
[im 156/208]
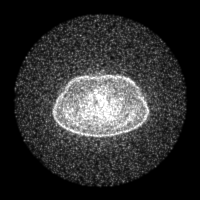
[im 208/208]
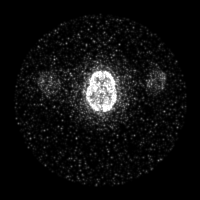

[Series 8: ct sk_thigh 5.0 b70f lung_bone · axial · 5.0mm · 0.60mm/px · 1 of 53 slices shown]
[im 1/53  bone]
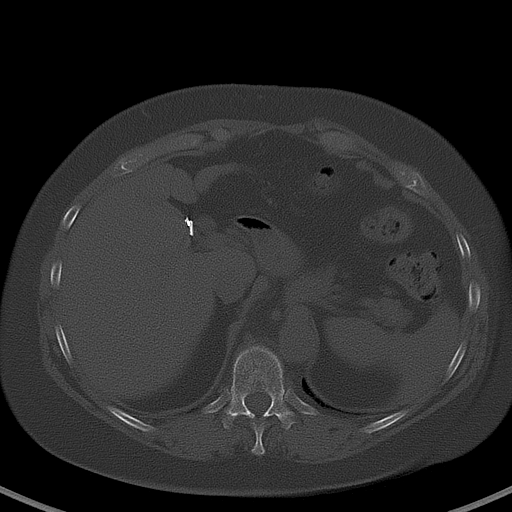

[Series 603: mip range 5 · coronal · 1.72mm/px · 1 of 32 slices shown]
[im 1/32]
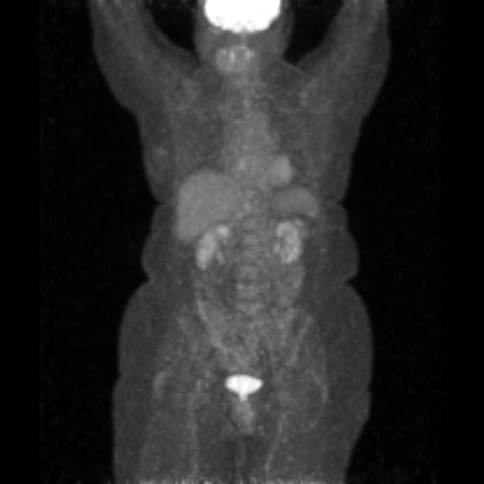

[Series 604: range-ct sk_thigh 5.0 (id)<alpha range> · 2 of 89 slices shown (1 of 2)]
[im 1/89]
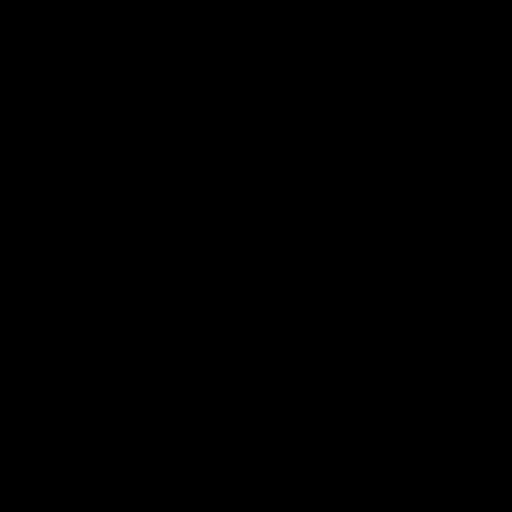
[im 89/89]
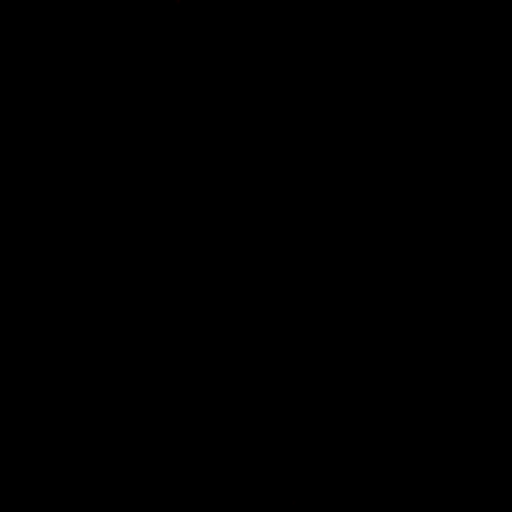

[Series 605: range-ct sk_thigh 5.0 (id)<alpha range> · 5 of 201 slices shown (2 of 2)]
[im 1/201]
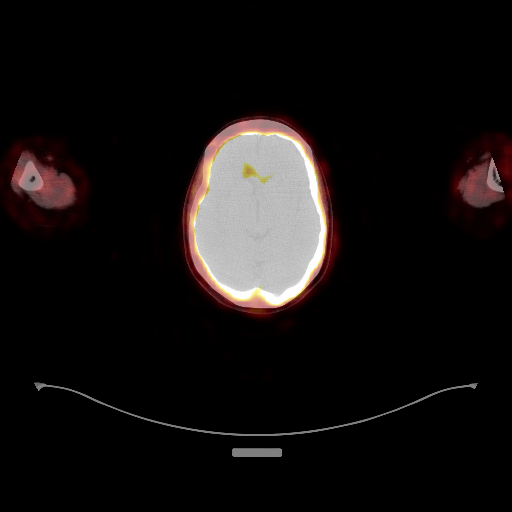
[im 51/201]
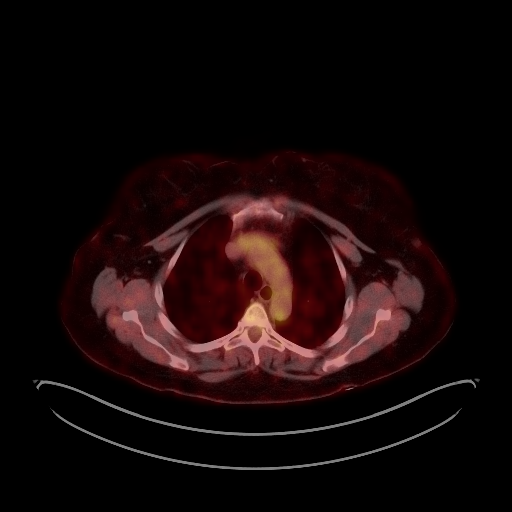
[im 101/201]
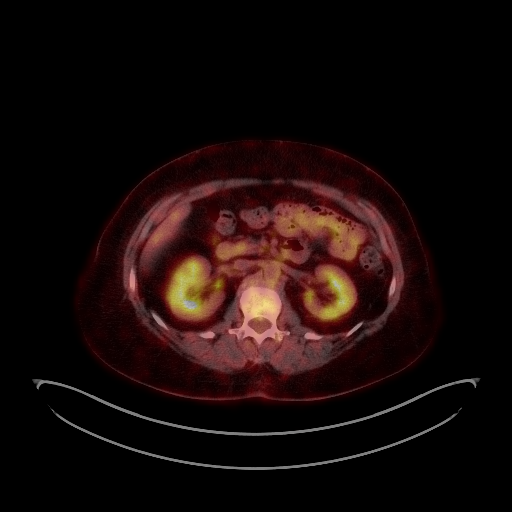
[im 151/201]
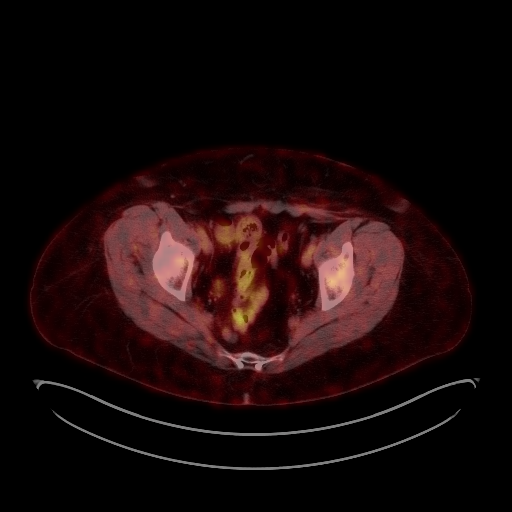
[im 201/201]
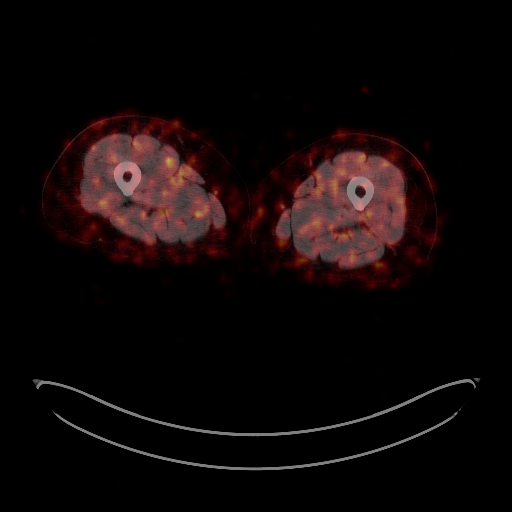

[25 of 25 positions shown; findings below may reference images not displayed]

FINDINGS: Mediastinal blood pool activity: SUV max

Liver activity: SUV max NA

NECK: No hypermetabolic lymph nodes in the neck.

Incidental CT findings: none

CHEST: No hypermetabolic mediastinal or hilar nodes. No suspicious
pulmonary nodules on the CT scan.

Incidental CT findings: Large hiatal hernia with [DATE] the stomach in
the posterior middle mediastinum.

ABDOMEN/PELVIS: no abnormal metabolic activity in the LEFT hepatic
lobe. Atrophy of the LEFT hepatic lobe is again demonstrated. No
discrete focal hepatic lesions above background.

No hypermetabolic abdominopelvic lymph nodes.

Incidental CT findings: Post hysterectomy.  Adnexa unremarkable

SKELETON: No focal hypermetabolic activity to suggest skeletal
metastasis.

Incidental CT findings: none
IMPRESSION: 1. No hypermetabolic activity in the LEFT hepatic lobe to localize a
primary liver or bile duct carcinoma. Of note cholangiocarcinoma and
metastatic carcinoma can have low metabolic activity. However, no
PET-CT evidence to suggest either of these malignancies.
2. Large hiatal hernia.

## 2022-07-23 ENCOUNTER — Ambulatory Visit (INDEPENDENT_AMBULATORY_CARE_PROVIDER_SITE_OTHER): Payer: Medicare Other | Admitting: Nurse Practitioner

## 2022-07-23 ENCOUNTER — Encounter: Payer: Self-pay | Admitting: Nurse Practitioner

## 2022-07-23 ENCOUNTER — Ambulatory Visit (INDEPENDENT_AMBULATORY_CARE_PROVIDER_SITE_OTHER): Payer: Medicare Other

## 2022-07-23 VITALS — BP 137/84 | HR 77 | Temp 98.0°F | Resp 20 | Ht <= 58 in | Wt 107.0 lb

## 2022-07-23 DIAGNOSIS — E559 Vitamin D deficiency, unspecified: Secondary | ICD-10-CM

## 2022-07-23 DIAGNOSIS — Z0000 Encounter for general adult medical examination without abnormal findings: Secondary | ICD-10-CM

## 2022-07-23 DIAGNOSIS — J849 Interstitial pulmonary disease, unspecified: Secondary | ICD-10-CM

## 2022-07-23 DIAGNOSIS — Z0001 Encounter for general adult medical examination with abnormal findings: Secondary | ICD-10-CM

## 2022-07-23 NOTE — Patient Instructions (Signed)
Exercising to Stay Healthy To become healthy and stay healthy, it is recommended that you do moderate-intensity and vigorous-intensity exercise. You can tell that you are exercising at a moderate intensity if your heart starts beating faster and you start breathing faster but can still hold a conversation. You can tell that you are exercising at a vigorous intensity if you are breathing much harder and faster and cannot hold a conversation while exercising. How can exercise benefit me? Exercising regularly is important. It has many health benefits, such as: Improving overall fitness, flexibility, and endurance. Increasing bone density. Helping with weight control. Decreasing body fat. Increasing muscle strength and endurance. Reducing stress and tension, anxiety, depression, or anger. Improving overall health. What guidelines should I follow while exercising? Before you start a new exercise program, talk with your health care provider. Do not exercise so much that you hurt yourself, feel dizzy, or get very short of breath. Wear comfortable clothes and wear shoes with good support. Drink plenty of water while you exercise to prevent dehydration or heat stroke. Work out until your breathing and your heartbeat get faster (moderate intensity). How often should I exercise? Choose an activity that you enjoy, and set realistic goals. Your health care provider can help you make an activity plan that is individually designed and works best for you. Exercise regularly as told by your health care provider. This may include: Doing strength training two times a week, such as: Lifting weights. Using resistance bands. Push-ups. Sit-ups. Yoga. Doing a certain intensity of exercise for a given amount of time. Choose from these options: A total of 150 minutes of moderate-intensity exercise every week. A total of 75 minutes of vigorous-intensity exercise every week. A mix of moderate-intensity and  vigorous-intensity exercise every week. Children, pregnant women, people who have not exercised regularly, people who are overweight, and older adults may need to talk with a health care provider about what activities are safe to perform. If you have a medical condition, be sure to talk with your health care provider before you start a new exercise program. What are some exercise ideas? Moderate-intensity exercise ideas include: Walking 1 mile (1.6 km) in about 15 minutes. Biking. Hiking. Golfing. Dancing. Water aerobics. Vigorous-intensity exercise ideas include: Walking 4.5 miles (7.2 km) or more in about 1 hour. Jogging or running 5 miles (8 km) in about 1 hour. Biking 10 miles (16.1 km) or more in about 1 hour. Lap swimming. Roller-skating or in-line skating. Cross-country skiing. Vigorous competitive sports, such as football, basketball, and soccer. Jumping rope. Aerobic dancing. What are some everyday activities that can help me get exercise? Yard work, such as: Pushing a lawn mower. Raking and bagging leaves. Washing your car. Pushing a stroller. Shoveling snow. Gardening. Washing windows or floors. How can I be more active in my day-to-day activities? Use stairs instead of an elevator. Take a walk during your lunch break. If you drive, park your car farther away from your work or school. If you take public transportation, get off one stop early and walk the rest of the way. Stand up or walk around during all of your indoor phone calls. Get up, stretch, and walk around every 30 minutes throughout the day. Enjoy exercise with a friend. Support to continue exercising will help you keep a regular routine of activity. Where to find more information You can find more information about exercising to stay healthy from: U.S. Department of Health and Human Services: www.hhs.gov Centers for Disease Control and Prevention (  CDC): www.cdc.gov Summary Exercising regularly is  important. It will improve your overall fitness, flexibility, and endurance. Regular exercise will also improve your overall health. It can help you control your weight, reduce stress, and improve your bone density. Do not exercise so much that you hurt yourself, feel dizzy, or get very short of breath. Before you start a new exercise program, talk with your health care provider. This information is not intended to replace advice given to you by your health care provider. Make sure you discuss any questions you have with your health care provider. Document Revised: 12/05/2020 Document Reviewed: 12/05/2020 Elsevier Patient Education  2023 [employer]  

## 2022-07-23 NOTE — Progress Notes (Signed)
Subjective:    Patient ID: Amber Winters, female    DOB: 05-13-1956, 66 y.o.   MRN: [id number]   Chief Complaint: medical management of chronic issues     HPI:  Amber Winters is a 66 y.o. who identifies as a female who was assigned female at birth.   Social history: Lives with: husband Work history: retired from school system   Comes in today for follow up of the following chronic medical issues:  1. Annual physical exam No pap today  2. ILD (interstitial lung disease) ([place]) Says that her breathing is good. No issues  3. Vitamin D deficiency Is on daily vitamin d supplement   New complaints: None today  Allergies  Allergen Reactions   Ciprofloxacin Other (See Comments)    Patient states this causes severe muscle aching and discomfort.   Nitrofurantoin Other (See Comments)    ILD   Asa [Aspirin]     Tinnitis   Sulfa Antibiotics Nausea And Vomiting   Zithromax [Azithromycin]     Abdominal cramps   Outpatient Encounter Medications as of 07/23/2022  Medication Sig   Ascorbic Acid (VITAMIN C) 100 MG tablet Take 100 mg by mouth daily.   cephALEXin (KEFLEX) 250 MG capsule Take 250 mg by mouth daily.   cyanocobalamin 100 MCG tablet Take 100 mcg by mouth daily.   famotidine (PEPCID) 10 MG tablet Take 10 mg by mouth daily. (Patient not taking: Reported on 10/13/2021)   MAGNESIUM PO Take 1 tablet by mouth daily.   Multiple Vitamins-Minerals (ZINC PO) Take by mouth.   NON FORMULARY Take 1 tablet by mouth daily. Liver health   VITAMIN D PO Take 1 tablet by mouth daily.   vitamin E 1000 UNIT capsule Take 1,000 Units by mouth daily.   No facility-administered encounter medications on file as of 07/23/2022.    Past Surgical History:  Procedure Laterality Date   ABDOMINAL HYSTERECTOMY  2000   BRONCHIAL WASHINGS  11/12/2019   Procedure: BRONCHIAL WASHINGS;  Surgeon: Brand Males, MD;  Location: [facility];  Service: Endoscopy;;   CATARACT EXTRACTION, BILATERAL      CHOLECYSTECTOMY  2001   HERNIA REPAIR     URETHRAL SLING     VIDEO BRONCHOSCOPY  11/12/2019   Procedure: VIDEO BRONCHOSCOPY WITH FLUORO;  Surgeon: Brand Males, MD;  Location: [facility];  Service: Endoscopy;;    Family History  Problem Relation Age of Onset   Ovarian cancer Mother    Amber Winters Father    Hypertension Brother    Healthy Daughter    Healthy Daughter    Colon cancer Neg Hx    Esophageal cancer Neg Hx    Inflammatory bowel disease Neg Hx    Liver disease Neg Hx    Pancreatic cancer Neg Hx    Rectal cancer Neg Hx    Stomach cancer Neg Hx       Controlled substance contract: n/a     Review of Systems  Constitutional:  Negative for diaphoresis.  Eyes:  Negative for pain.  Respiratory:  Negative for shortness of breath.   Cardiovascular:  Negative for chest pain, palpitations and leg swelling.  Gastrointestinal:  Negative for abdominal pain.  Endocrine: Negative for polydipsia.  Skin:  Negative for rash.  Neurological:  Negative for dizziness, weakness and headaches.  Hematological:  Does not bruise/bleed easily.  All other systems reviewed and are negative.      Objective:   Physical Exam Vitals and nursing note reviewed.  Constitutional:  General: She is not in acute distress.    Appearance: Normal appearance. She is well-developed.  HENT:     Head: Normocephalic.     Right Ear: Tympanic membrane normal.     Left Ear: Tympanic membrane normal.     Nose: Nose normal.     Mouth/Throat:     Mouth: Mucous membranes are moist.  Eyes:     Pupils: Pupils are equal, round, and reactive to light.  Neck:     Vascular: No carotid bruit or JVD.  Cardiovascular:     Rate and Rhythm: Normal rate and regular rhythm.     Heart sounds: Normal heart sounds.  Pulmonary:     Effort: Pulmonary effort is normal. No respiratory distress.     Breath sounds: Normal breath sounds. No wheezing or rales.  Chest:     Chest wall: No tenderness.  Abdominal:      General: Bowel sounds are normal. There is no distension or abdominal bruit.     Palpations: Abdomen is soft. There is no hepatomegaly, splenomegaly, mass or pulsatile mass.     Tenderness: There is no abdominal tenderness.  Musculoskeletal:        General: Normal range of motion.     Cervical back: Normal range of motion and neck supple.  Lymphadenopathy:     Cervical: No cervical adenopathy.  Skin:    General: Skin is warm and dry.  Neurological:     Mental Status: She is alert and oriented to person, place, and time.     Deep Tendon Reflexes: Reflexes are normal and symmetric.  Psychiatric:        Behavior: Behavior normal.        Thought Content: Thought content normal.        Judgment: Judgment normal.    BP 137/84   Pulse 77   Temp 98 F (36.7 C) (Temporal)   Resp 20   Ht _0  (1.473 m)   Wt 107 lb (48.5 kg)   SpO2 99%   BMI 22.36 kg/m         Assessment & Plan:   Amber Winters comes in today with chief complaint of Annual Exam (No pap)   Diagnosis and orders addressed:  1. Annual physical exam  - DG Chest 2 View - EKG 12-Lead - CBC with Differential/Platelet - CMP14+EGFR - Lipid panel - Thyroid Panel With TSH  2. ILD (interstitial lung disease) ([place]) Report any breathing issues  3. Vitamin D deficiency Continue vitamin d supplement - VITAMIN D 25 Hydroxy (Vit-D Deficiency, Fractures)   Labs pending Health Maintenance reviewed Diet and exercise encouraged  Follow up plan: 6 months   Mary-Margaret Hassell Done, FNP

## 2022-07-24 LAB — CBC WITH DIFFERENTIAL/PLATELET
Basophils Absolute: 0 10*3/uL (ref 0.0–0.2)
Basos: 1 %
EOS (ABSOLUTE): 0.1 10*3/uL (ref 0.0–0.4)
Eos: 2 %
Hematocrit: 38 % (ref 34.0–46.6)
Hemoglobin: 12.5 g/dL (ref 11.1–15.9)
Immature Grans (Abs): 0 10*3/uL (ref 0.0–0.1)
Immature Granulocytes: 0 %
Lymphocytes Absolute: 2 10*3/uL (ref 0.7–3.1)
Lymphs: 34 %
MCH: 32.1 pg (ref 26.6–33.0)
MCHC: 32.9 g/dL (ref 31.5–35.7)
MCV: 97 fL (ref 79–97)
Monocytes Absolute: 0.5 10*3/uL (ref 0.1–0.9)
Monocytes: 8 %
Neutrophils Absolute: 3.3 10*3/uL (ref 1.4–7.0)
Neutrophils: 55 %
Platelets: 222 10*3/uL (ref 150–450)
RBC: 3.9 x10E6/uL (ref 3.77–5.28)
RDW: 12.1 % (ref 11.7–15.4)
WBC: 5.9 10*3/uL (ref 3.4–10.8)

## 2022-07-24 LAB — LIPID PANEL
Chol/HDL Ratio: 3.2 ratio (ref 0.0–4.4)
Cholesterol, Total: 210 mg/dL — ABNORMAL HIGH (ref 100–199)
HDL: 65 mg/dL (ref >39)
LDL Chol Calc (NIH): 132 mg/dL — ABNORMAL HIGH (ref 0–99)
Triglycerides: 74 mg/dL (ref 0–149)
VLDL Cholesterol Cal: 13 mg/dL (ref 5–40)

## 2022-07-24 LAB — THYROID PANEL WITH TSH
Free Thyroxine Index: 2.8 (ref 1.2–4.9)
T3 Uptake Ratio: 34 % (ref 24–39)
T4, Total: 8.2 ug/dL (ref 4.5–12.0)
TSH: 1.37 u[IU]/mL (ref 0.450–4.500)

## 2022-07-24 LAB — CMP14+EGFR
ALT: 17 IU/L (ref 0–32)
AST: 23 IU/L (ref 0–40)
Albumin/Globulin Ratio: 2 (ref 1.2–2.2)
Albumin: 4.5 g/dL (ref 3.9–4.9)
Alkaline Phosphatase: 78 IU/L (ref 44–121)
BUN/Creatinine Ratio: 26 (ref 12–28)
BUN: 18 mg/dL (ref 8–27)
Bilirubin Total: 0.9 mg/dL (ref 0.0–1.2)
CO2: 25 mmol/L (ref 20–29)
Calcium: 9.5 mg/dL (ref 8.7–10.3)
Chloride: 102 mmol/L (ref 96–106)
Creatinine, Ser: 0.68 mg/dL (ref 0.57–1.00)
Globulin, Total: 2.2 g/dL (ref 1.5–4.5)
Glucose: 80 mg/dL (ref 70–99)
Potassium: 4.4 mmol/L (ref 3.5–5.2)
Sodium: 138 mmol/L (ref 134–144)
Total Protein: 6.7 g/dL (ref 6.0–8.5)
eGFR: 97 mL/min/{1.73_m2} (ref >59)

## 2022-07-24 LAB — VITAMIN D 25 HYDROXY (VIT D DEFICIENCY, FRACTURES): Vit D, 25-Hydroxy: 43.8 ng/mL (ref 30.0–100.0)

## 2022-08-27 ENCOUNTER — Telehealth: Payer: Self-pay | Admitting: Nurse Practitioner

## 2022-08-27 DIAGNOSIS — Z859 Personal history of malignant neoplasm, unspecified: Secondary | ICD-10-CM

## 2022-08-27 NOTE — Telephone Encounter (Signed)
Patient says she is due to see her Dermatologist for her yearly visit but says her dermatologist is no longer in business. Wants MMM to send her dermatology referral to [facility] in [place]. Phone# [phone]

## 2022-08-27 NOTE — Telephone Encounter (Signed)
Patient notified and verbalized understanding. 

## 2022-09-21 ENCOUNTER — Ambulatory Visit (INDEPENDENT_AMBULATORY_CARE_PROVIDER_SITE_OTHER): Payer: Medicare Other

## 2022-09-21 VITALS — Ht <= 58 in | Wt 106.0 lb

## 2022-09-21 DIAGNOSIS — Z0001 Encounter for general adult medical examination with abnormal findings: Secondary | ICD-10-CM | POA: Diagnosis not present

## 2022-09-21 DIAGNOSIS — Z780 Asymptomatic menopausal state: Secondary | ICD-10-CM

## 2022-09-21 DIAGNOSIS — Z1231 Encounter for screening mammogram for malignant neoplasm of breast: Secondary | ICD-10-CM

## 2022-09-21 DIAGNOSIS — Z0000 Encounter for general adult medical examination without abnormal findings: Secondary | ICD-10-CM

## 2022-09-21 NOTE — Patient Instructions (Signed)
Amber Winters , Thank you for taking time to come for your Medicare Wellness Visit. I appreciate your ongoing commitment to your health goals. Please review the following plan we discussed and let me know if I can assist you in the future.   These are the goals we discussed:  Goals      DIET - INCREASE WATER INTAKE        This is a list of the screening recommended for you and due dates:  Health Maintenance  Topic Date Due   DTaP/Tdap/Td vaccine (2 - Td or Tdap) 07/23/2020   Pneumonia Vaccine (2 - PCV) 07/24/2023*   DEXA scan (bone density measurement)  08/19/2023*   COVID-19 Vaccine (5 - 2023-24 season) 09/26/2022   Medicare Annual Wellness Visit  09/22/2023   Mammogram  10/02/2023   Colon Cancer Screening  05/26/2028   Flu Shot  Completed   Hepatitis C Screening: USPSTF Recommendation to screen - Ages 18-79 yo.  Completed   Zoster (Shingles) Vaccine  Completed   HPV Vaccine  Aged Out  *Topic was postponed. The date shown is not the original due date.    Advanced directives: Advance directive discussed with you today. I have provided a copy for you to complete at home and have notarized. Once this is complete please bring a copy in to our office so we can scan it into your chart.   Conditions/risks identified: Aim for 30 minutes of exercise or brisk walking, 6-8 glasses of water, and 5 servings of fruits and vegetables each day.   Next appointment: Follow up in one year for your annual wellness visit    Preventive Care 65 Years and Older, Female Preventive care refers to lifestyle choices and visits with your health care provider that can promote health and wellness. What does preventive care include? A yearly physical exam. This is also called an annual well check. Dental exams once or twice a year. Routine eye exams. Ask your health care provider how often you should have your eyes checked. Personal lifestyle choices, including: Daily care of your teeth and gums. Regular  physical activity. Eating a healthy diet. Avoiding tobacco and drug use. Limiting alcohol use. Practicing safe sex. Taking low-dose aspirin every day. Taking vitamin and mineral supplements as recommended by your health care provider. What happens during an annual well check? The services and screenings done by your health care provider during your annual well check will depend on your age, overall health, lifestyle risk factors, and family history of disease. Counseling  Your health care provider may ask you questions about your: Alcohol use. Tobacco use. Drug use. Emotional well-being. Home and relationship well-being. Sexual activity. Eating habits. History of falls. Memory and ability to understand (cognition). Work and work Statistician. Reproductive health. Screening  You may have the following tests or measurements: Height, weight, and BMI. Blood pressure. Lipid and cholesterol levels. These may be checked every 5 years, or more frequently if you are over 13 years old. Skin check. Lung cancer screening. You may have this screening every year starting at age 52 if you have a 30-pack-year history of smoking and currently smoke or have quit within the past 15 years. Fecal occult blood test (FOBT) of the stool. You may have this test every year starting at age 56. Flexible sigmoidoscopy or colonoscopy. You may have a sigmoidoscopy every 5 years or a colonoscopy every 10 years starting at age 52. Hepatitis C blood test. Hepatitis B blood test. Sexually transmitted disease (STD)  testing. Diabetes screening. This is done by checking your blood sugar (glucose) after you have not eaten for a while (fasting). You may have this done every 1-3 years. Bone density scan. This is done to screen for osteoporosis. You may have this done starting at age 30. Mammogram. This may be done every 1-2 years. Talk to your health care provider about how often you should have regular mammograms. Talk  with your health care provider about your test results, treatment options, and if necessary, the need for more tests. Vaccines  Your health care provider may recommend certain vaccines, such as: Influenza vaccine. This is recommended every year. Tetanus, diphtheria, and acellular pertussis (Tdap, Td) vaccine. You may need a Td booster every 10 years. Zoster vaccine. You may need this after age 62. Pneumococcal 13-valent conjugate (PCV13) vaccine. One dose is recommended after age 72. Pneumococcal polysaccharide (PPSV23) vaccine. One dose is recommended after age 27. Talk to your health care provider about which screenings and vaccines you need and how often you need them. This information is not intended to replace advice given to you by your health care provider. Make sure you discuss any questions you have with your health care provider. Document Released: 09/05/2015 Document Revised: 04/28/2016 Document Reviewed: 06/10/2015 Elsevier Interactive Patient Education  2017 [place] Prevention in the Home Falls can cause injuries. They can happen to people of all ages. There are many things you can do to make your home safe and to help prevent falls. What can I do on the outside of my home? Regularly fix the edges of walkways and driveways and fix any cracks. Remove anything that might make you trip as you walk through a door, such as a raised step or threshold. Trim any bushes or trees on the path to your home. Use bright outdoor lighting. Clear any walking paths of anything that might make someone trip, such as rocks or tools. Regularly check to see if handrails are loose or broken. Make sure that both sides of any steps have handrails. Any raised decks and porches should have guardrails on the edges. Have any leaves, snow, or ice cleared regularly. Use sand or salt on walking paths during winter. Clean up any spills in your garage right away. This includes oil or grease  spills. What can I do in the bathroom? Use night lights. Install grab bars by the toilet and in the tub and shower. Do not use towel bars as grab bars. Use non-skid mats or decals in the tub or shower. If you need to sit down in the shower, use a plastic, non-slip stool. Keep the floor dry. Clean up any water that spills on the floor as soon as it happens. Remove soap buildup in the tub or shower regularly. Attach bath mats securely with double-sided non-slip rug tape. Do not have throw rugs and other things on the floor that can make you trip. What can I do in the bedroom? Use night lights. Make sure that you have a light by your bed that is easy to reach. Do not use any sheets or blankets that are too big for your bed. They should not hang down onto the floor. Have a firm chair that has side arms. You can use this for support while you get dressed. Do not have throw rugs and other things on the floor that can make you trip. What can I do in the kitchen? Clean up any spills right away. Avoid walking on wet  floors. Keep items that you use a lot in easy-to-reach places. If you need to reach something above you, use a strong step stool that has a grab bar. Keep electrical cords out of the way. Do not use floor polish or wax that makes floors slippery. If you must use wax, use non-skid floor wax. Do not have throw rugs and other things on the floor that can make you trip. What can I do with my stairs? Do not leave any items on the stairs. Make sure that there are handrails on both sides of the stairs and use them. Fix handrails that are broken or loose. Make sure that handrails are as long as the stairways. Check any carpeting to make sure that it is firmly attached to the stairs. Fix any carpet that is loose or worn. Avoid having throw rugs at the top or bottom of the stairs. If you do have throw rugs, attach them to the floor with carpet tape. Make sure that you have a light switch at the  top of the stairs and the bottom of the stairs. If you do not have them, ask someone to add them for you. What else can I do to help prevent falls? Wear shoes that: Do not have high heels. Have rubber bottoms. Are comfortable and fit you well. Are closed at the toe. Do not wear sandals. If you use a stepladder: Make sure that it is fully opened. Do not climb a closed stepladder. Make sure that both sides of the stepladder are locked into place. Ask someone to hold it for you, if possible. Clearly mark and make sure that you can see: Any grab bars or handrails. First and last steps. Where the edge of each step is. Use tools that help you move around (mobility aids) if they are needed. These include: Canes. Walkers. Scooters. Crutches. Turn on the lights when you go into a dark area. Replace any light bulbs as soon as they burn out. Set up your furniture so you have a clear path. Avoid moving your furniture around. If any of your floors are uneven, fix them. If there are any pets around you, be aware of where they are. Review your medicines with your doctor. Some medicines can make you feel dizzy. This can increase your chance of falling. Ask your doctor what other things that you can do to help prevent falls. This information is not intended to replace advice given to you by your health care provider. Make sure you discuss any questions you have with your health care provider. Document Released: 06/05/2009 Document Revised: 01/15/2016 Document Reviewed: 09/13/2014 Elsevier Interactive Patient Education  2017 [employer].

## 2022-09-21 NOTE — Progress Notes (Signed)
Subjective:   Amber Winters is a 67 y.o. female who presents for an Initial Medicare Annual Wellness Visit. I connected with  Amber Winters on 09/21/22 by a audio enabled telemedicine application and verified that I am speaking with the correct person using two identifiers.  Patient Location: Home  Provider Location: Home Office  I discussed the limitations of evaluation and management by telemedicine. The patient expressed understanding and agreed to proceed.  Review of Systems     Cardiac Risk Factors include: advanced age (>30mn, >>72women)     Objective:    Today's Vitals   09/21/22 1347  Weight: 106 lb (48.1 kg)  Height: '4\' 10"'$  (1.473 m)   Body mass index is 22.15 kg/m.     09/21/2022    1:54 PM 10/13/2021   12:03 PM 04/09/2021    8:08 AM 06/03/2020    2:01 PM 04/30/2020    4:06 PM 11/12/2019   12:43 PM 09/06/2014   10:17 AM  Advanced Directives  Does Patient Have a Medical Advance Directive? No No No No No No No  Would patient like information on creating a medical advance directive? No - Patient declined No - Patient declined No - Patient declined No - Patient declined No - Patient declined No - Patient declined Yes - Educational materials given    Current Medications (verified) Outpatient Encounter Medications as of 09/21/2022  Medication Sig   Ascorbic Acid (VITAMIN C) 100 MG tablet Take 100 mg by mouth daily.   cyanocobalamin 100 MCG tablet Take 100 mcg by mouth daily.   MAGNESIUM PO Take 1 tablet by mouth daily.   Multiple Vitamins-Minerals (ZINC PO) Take by mouth.   NON FORMULARY Take 1 tablet by mouth daily. Liver health   VITAMIN D PO Take 1 tablet by mouth daily.   vitamin E 1000 UNIT capsule Take 1,000 Units by mouth daily.   zinc gluconate 50 MG tablet Take 50 mg by mouth daily.   famotidine (PEPCID) 10 MG tablet Take 10 mg by mouth daily. (Patient not taking: Reported on 09/21/2022)   No facility-administered encounter medications on file as of 09/21/2022.     Allergies (verified) Ciprofloxacin, Nitrofurantoin, Asa [aspirin], Sulfa antibiotics, and Zithromax [azithromycin]   History: Past Medical History:  Diagnosis Date   Hyperlipidemia    Squamous cell carcinoma of skin 07/07/2021   MOHs was done by Dr. [facility]   Past Surgical History:  Procedure Laterality Date   ABDOMINAL HYSTERECTOMY  2000   BRONCHIAL WASHINGS  11/12/2019   Procedure: BRONCHIAL WASHINGS;  Surgeon: RBrand Males MD;  Location: [facility];  Service: Endoscopy;;   CATARACT EXTRACTION, BILATERAL     CHOLECYSTECTOMY  2001   HERNIA REPAIR     URETHRAL SLING     VIDEO BRONCHOSCOPY  11/12/2019   Procedure: VIDEO BRONCHOSCOPY WITH FLUORO;  Surgeon: RBrand Males MD;  Location: [facility];  Service: Endoscopy;;   Family History  Problem Relation Age of Onset   Ovarian cancer Mother    Anuerysm Father    Hypertension Brother    Healthy Daughter    Healthy Daughter    Colon cancer Neg Hx    Esophageal cancer Neg Hx    Inflammatory bowel disease Neg Hx    Liver disease Neg Hx    Pancreatic cancer Neg Hx    Rectal cancer Neg Hx    Stomach cancer Neg Hx    Social History   Socioeconomic History  Marital status: Married    Spouse name: Not on file   Number of children: 2   Years of education: Not on file   Highest education level: Not on file  Occupational History   Not on file  Tobacco Use   Smoking status: Never   Smokeless tobacco: Never  Vaping Use   Vaping Use: Never used  Substance and Sexual Activity   Alcohol use: No   Drug use: No   Sexual activity: Not Currently  Other Topics Concern   Not on file  Social History Narrative   Not on file   Social Determinants of Health   Financial Resource Strain: Low Risk  (09/21/2022)   Overall Financial Resource Strain (CARDIA)    Difficulty of Paying Living Expenses: Not hard at all  Food Insecurity: No Food Insecurity (09/21/2022)   Hunger Vital Sign    Worried  About Running Out of Food in the Last Year: Never true    Ran Out of Food in the Last Year: Never true  Transportation Needs: No Transportation Needs (09/21/2022)   PRAPARE - Hydrologist (Medical): No    Lack of Transportation (Non-Medical): No  Physical Activity: Insufficiently Active (09/21/2022)   Exercise Vital Sign    Days of Exercise per Week: 3 days    Minutes of Exercise per Session: 30 min  Stress: No Stress Concern Present (09/21/2022)   [place]    Feeling of Stress : Not at all  Social Connections: [place] (09/21/2022)   Social Connection and Isolation Panel [NHANES]    Frequency of Communication with Friends and Family: More than three times a week    Frequency of Social Gatherings with Friends and Family: More than three times a week    Attends Religious Services: More than 4 times per year    Active Member of [employer] or Organizations: Yes    Attends Music therapist: More than 4 times per year    Marital Status: Married    Tobacco Counseling Counseling given: Not Answered   Clinical Intake:  Pre-visit preparation completed: Yes  Pain : No/denies pain     Nutritional Risks: None Diabetes: No  How often do you need to have someone help you when you read instructions, pamphlets, or other written materials from your doctor or pharmacy?: 1 - Never  Diabetic?no   Interpreter Needed?: No  Information entered by :: Jadene Pierini, LPN   Activities of Daily Living    09/21/2022    1:53 PM  In your present state of health, do you have any difficulty performing the following activities:  Hearing? 0  Vision? 0  Difficulty concentrating or making decisions? 0  Walking or climbing stairs? 0  Dressing or bathing? 0  Doing errands, shopping? 0  Preparing Food and eating ? N  Using the Toilet? N  In the past six months, have you accidently leaked  urine? N  Do you have problems with loss of bowel control? N  Managing your Medications? N  Managing your Finances? N  Housekeeping or managing your Housekeeping? N    Patient Care Team: Chevis Pretty, FNP as PCP - General (Nurse Practitioner) Brien Mates, RN as Oncology Nurse Navigator (Oncology) Lavonna Monarch, MD (Inactive) as Consulting Physician (Dermatology)  Indicate any recent Medical Services you may have received from other than [facility] providers in the past year (date may be approximate).  Assessment:   This is a routine wellness examination for [place].  Hearing/Vision screen Vision Screening - Comments:: Wears rx glasses - up to date with routine eye exams with  Dr.Perez  Dietary issues and exercise activities discussed: Current Exercise Habits: Home exercise routine, Type of exercise: walking, Time (Minutes): 30, Frequency (Times/Week): 3, Weekly Exercise (Minutes/Week): 90, Exercise limited by: None identified   Goals Addressed             This Visit's Progress    DIET - INCREASE WATER INTAKE         Depression Screen    09/21/2022    1:52 PM 07/23/2022    9:23 AM 10/14/2020    3:49 PM 04/21/2020    4:18 PM 06/14/2019    9:27 AM 03/28/2018    9:30 AM 03/09/2018    8:00 AM  PHQ 2/9 Scores  PHQ - 2 Score 0 0 0 0 0 0 0  PHQ- 9 Score 0 0         Fall Risk    09/21/2022    1:49 PM 07/23/2022    9:23 AM 10/14/2020    3:49 PM 04/21/2020    4:18 PM 06/14/2019    9:27 AM  Fall Risk   Falls in the past year? 0 0 0 0 0  Number falls in past yr: 0      Injury with Fall? 0      Risk for fall due to : No Fall Risks      Follow up Falls prevention discussed        FALL RISK PREVENTION PERTAINING TO THE HOME:  Any stairs in or around the home? Yes  If so, are there any without handrails? No  Home free of loose throw rugs in walkways, pet beds, electrical cords, etc? Yes  Adequate lighting in your home to reduce risk of falls? Yes   ASSISTIVE  DEVICES UTILIZED TO PREVENT FALLS:  Life alert? No  Use of a cane, walker or w/c? No  Grab bars in the bathroom? No  Shower chair or bench in shower? Yes  Elevated toilet seat or a handicapped toilet? No          09/21/2022    1:54 PM 09/21/2022    1:53 PM  6CIT Screen  What Year? 0 points 0 points  What month? 0 points 0 points  What time? 0 points 0 points  Count back from 20 0 points 0 points  Months in reverse 0 points 0 points  Repeat phrase 0 points 0 points  Total Score 0 points 0 points    Immunizations Immunization History  Administered Date(s) Administered   Influenza Inj Mdck Quad Pf 05/31/2017   Influenza Split 06/01/2013   Influenza, Quadrivalent, Recombinant, Inj, Pf 06/04/2019   Influenza,inj,Quad PF,6+ Mos 05/24/2014, 06/11/2015, 06/06/2018, 06/03/2020   Influenza-Unspecified 05/26/2016, 06/06/2018, 06/22/2022   Moderna SARS-COV2 Booster Vaccination 02/09/2021, 08/01/2022   Moderna Sars-Covid-2 Vaccination 10/24/2019, 11/21/2019, 07/15/2020, 11/15/2021   Pneumococcal Polysaccharide-23 09/06/2014   Tdap 07/23/2010   Zoster Recombinat (Shingrix) 04/02/2021, 08/29/2021    TDAP status: Due, Education has been provided regarding the importance of this vaccine. Advised may receive this vaccine at local pharmacy or Health Dept. Aware to provide a copy of the vaccination record if obtained from local pharmacy or Health Dept. Verbalized acceptance and understanding.  Flu Vaccine status: Up to date  Pneumococcal vaccine status: Up to date  Covid-19 vaccine status: Completed vaccines  Qualifies for Shingles Vaccine? Yes  Zostavax completed Yes   Shingrix Completed?: Yes  Screening Tests Health Maintenance  Topic Date Due   DTaP/Tdap/Td (2 - Td or Tdap) 07/23/2020   Pneumonia Vaccine 72+ Years old (2 - PCV) 07/24/2023 (Originally 07/29/2021)   DEXA SCAN  08/19/2023 (Originally 03/28/2020)   COVID-19 Vaccine (5 - 2023-24 season) 09/26/2022   Medicare Annual  Wellness (AWV)  09/22/2023   MAMMOGRAM  10/02/2023   COLONOSCOPY (Pts 45-64yr Insurance coverage will need to be confirmed)  05/26/2028   INFLUENZA VACCINE  Completed   Hepatitis C Screening  Completed   Zoster Vaccines- Shingrix  Completed   HPV VACCINES  Aged Out    Health Maintenance  Health Maintenance Due  Topic Date Due   DTaP/Tdap/Td (2 - Td or Tdap) 07/23/2020    Colorectal cancer screening: Type of screening: Colonoscopy. Completed 05/26/2018. Repeat every 10 years  Mammogram status: Completed 10/01/2021. Repeat every year  Bone Density status: Ordered 09/21/2022. Pt provided with contact info and advised to call to schedule appt.  Lung Cancer Screening: (Low Dose CT Chest recommended if Age 67-80years, 30 pack-year currently smoking OR have quit w/in 15years.) does not qualify.   Lung Cancer Screening Referral: n/a  Additional Screening:  Hepatitis C Screening: does not qualify; Completed 06/14/2019  Vision Screening: Recommended annual ophthalmology exams for early detection of glaucoma and other disorders of the eye. Is the patient up to date with their annual eye exam?  Yes  Who is the provider or what is the name of the office in which the patient attends annual eye exams? Dr.Perez  If pt is not established with a provider, would they like to be referred to a provider to establish care? No .   Dental Screening: Recommended annual dental exams for proper oral hygiene  Community Resource Referral / Chronic Care Management: CRR required this visit?  No   CCM required this visit?  No      Plan:     I have personally reviewed and noted the following in the patient's chart:   Medical and social history Use of alcohol, tobacco or illicit drugs  Current medications and supplements including opioid prescriptions. Patient is not currently taking opioid prescriptions. Functional ability and status Nutritional status Physical activity Advanced  directives List of other physicians Hospitalizations, surgeries, and ER visits in previous 12 months Vitals Screenings to include cognitive, depression, and falls Referrals and appointments  In addition, I have reviewed and discussed with patient certain preventive protocols, quality metrics, and best practice recommendations. A written personalized care plan for preventive services as well as general preventive health recommendations were provided to patient.     LDaphane Shepherd LPN   17/35/3299  Nurse Notes: Due Flu Vaccine

## 2022-10-07 ENCOUNTER — Other Ambulatory Visit (HOSPITAL_COMMUNITY): Payer: Self-pay | Admitting: Hematology

## 2022-10-07 DIAGNOSIS — K769 Liver disease, unspecified: Secondary | ICD-10-CM

## 2022-10-08 ENCOUNTER — Ambulatory Visit (HOSPITAL_COMMUNITY)
Admission: RE | Admit: 2022-10-08 | Discharge: 2022-10-08 | Disposition: A | Discharge status: Home / Self Care | Payer: Medicare Other | Source: Ambulatory Visit | Attending: Hematology | Admitting: Hematology

## 2022-10-08 ENCOUNTER — Inpatient Hospital Stay: Payer: Medicare Other | Attending: Hematology

## 2022-10-08 DIAGNOSIS — K838 Other specified diseases of biliary tract: Secondary | ICD-10-CM | POA: Insufficient documentation

## 2022-10-08 DIAGNOSIS — Z9049 Acquired absence of other specified parts of digestive tract: Secondary | ICD-10-CM | POA: Diagnosis not present

## 2022-10-08 DIAGNOSIS — K449 Diaphragmatic hernia without obstruction or gangrene: Secondary | ICD-10-CM | POA: Diagnosis not present

## 2022-10-08 DIAGNOSIS — R978 Other abnormal tumor markers: Secondary | ICD-10-CM | POA: Diagnosis not present

## 2022-10-08 DIAGNOSIS — K769 Liver disease, unspecified: Secondary | ICD-10-CM | POA: Insufficient documentation

## 2022-10-08 DIAGNOSIS — K7689 Other specified diseases of liver: Secondary | ICD-10-CM | POA: Insufficient documentation

## 2022-10-08 LAB — HEPATIC FUNCTION PANEL
ALT: 20 U/L (ref 0–44)
AST: 26 U/L (ref 15–41)
Albumin: 4 g/dL (ref 3.5–5.0)
Alkaline Phosphatase: 65 U/L (ref 38–126)
Bilirubin, Direct: 0.1 mg/dL (ref 0.0–0.2)
Indirect Bilirubin: 1.1 mg/dL — ABNORMAL HIGH (ref 0.3–0.9)
Total Bilirubin: 1.2 mg/dL (ref 0.3–1.2)
Total Protein: 7.1 g/dL (ref 6.5–8.1)

## 2022-10-08 MED ORDER — GADOBUTROL 1 MMOL/ML IV SOLN
5.0000 mL | Freq: Once | INTRAVENOUS | Status: AC | PRN
  Administered 2022-10-08: 5 mL via INTRAVENOUS

## 2022-10-09 LAB — CANCER ANTIGEN 19-9: CA 19-9: 31 U/mL (ref 0–35)

## 2022-10-12 ENCOUNTER — Ambulatory Visit: Payer: Medicare Other | Admitting: Dermatology

## 2022-10-20 ENCOUNTER — Inpatient Hospital Stay: Payer: Medicare Other | Admitting: Hematology

## 2022-10-20 VITALS — BP 136/77 | HR 79 | Temp 98.5°F | Resp 16 | Ht <= 58 in | Wt 110.4 lb

## 2022-10-20 DIAGNOSIS — K7689 Other specified diseases of liver: Secondary | ICD-10-CM | POA: Diagnosis not present

## 2022-10-20 DIAGNOSIS — K769 Liver disease, unspecified: Secondary | ICD-10-CM

## 2022-10-20 NOTE — Progress Notes (Signed)
[place], [place] [address]   CLINIC:  Medical Oncology/Hematology  PCP:  Chevis Pretty, [place] / [place] [address] [phone]   REASON FOR VISIT:  Follow-up for mass of the left lobe of the liver  PRIOR THERAPY: none  NGS Results: not done  CURRENT THERAPY: surveillance  BRIEF ONCOLOGIC HISTORY:  Oncology History   No history exists.    CANCER STAGING:  Cancer Staging  No matching staging information was found for the patient.  INTERVAL HISTORY:  Amber Winters, a 67 y.o. female, returns for follow-up of left lobe of liver mass.  She has lost about 50 pounds since last year and is doing weight watchers diet.  Denies any abdominal pain.  She feels that her knee arthritis and acid reflux has improved as a result of weight loss.  REVIEW OF SYSTEMS:  Review of Systems  Constitutional:  Negative for appetite change and fatigue.  Gastrointestinal:  Negative for abdominal pain, diarrhea, nausea and vomiting.  All other systems reviewed and are negative.   PAST MEDICAL/SURGICAL HISTORY:  Past Medical History:  Diagnosis Date   Hyperlipidemia    Squamous cell carcinoma of skin 07/07/2021   MOHs was done by Dr. Winifred [facility]   Past Surgical History:  Procedure Laterality Date   ABDOMINAL HYSTERECTOMY  2000   BRONCHIAL WASHINGS  11/12/2019   Procedure: BRONCHIAL WASHINGS;  Surgeon: Brand Males, MD;  Location: [facility];  Service: Endoscopy;;   CATARACT EXTRACTION, BILATERAL     CHOLECYSTECTOMY  2001   HERNIA REPAIR     URETHRAL SLING     VIDEO BRONCHOSCOPY  11/12/2019   Procedure: VIDEO BRONCHOSCOPY WITH FLUORO;  Surgeon: Brand Males, MD;  Location: [facility];  Service: Endoscopy;;    SOCIAL HISTORY:  Social History   Socioeconomic History   Marital status: Married    Spouse name: Not on file   Number of children: 2   Years of education: Not on file   Highest  education level: Not on file  Occupational History   Not on file  Tobacco Use   Smoking status: Never   Smokeless tobacco: Never  Vaping Use   Vaping Use: Never used  Substance and Sexual Activity   Alcohol use: No   Drug use: No   Sexual activity: Not Currently  Other Topics Concern   Not on file  Social History Narrative   Not on file   Social Determinants of Health   Financial Resource Strain: Low Risk  (09/21/2022)   Overall Financial Resource Strain (CARDIA)    Difficulty of Paying Living Expenses: Not hard at all  Food Insecurity: No Food Insecurity (09/21/2022)   Hunger Vital Sign    Worried About Running Out of Food in the Last Year: Never true    Ran Out of Food in the Last Year: Never true  Transportation Needs: No Transportation Needs (09/21/2022)   PRAPARE - Hydrologist (Medical): No    Lack of Transportation (Non-Medical): No  Physical Activity: Insufficiently Active (09/21/2022)   Exercise Vital Sign    Days of Exercise per Week: 3 days    Minutes of Exercise per Session: 30 min  Stress: No Stress Concern Present (09/21/2022)   [place]    Feeling of Stress : Not at all  Social Connections: [place] (09/21/2022)   Social Connection and  Isolation Panel [NHANES]    Frequency of Communication with Friends and Family: More than three times a week    Frequency of Social Gatherings with Friends and Family: More than three times a week    Attends Religious Services: More than 4 times per year    Active Member of [employer] or Organizations: Yes    Attends Music therapist: More than 4 times per year    Marital Status: Married  Human resources officer Violence: Not At Risk (09/21/2022)   Humiliation, Afraid, Rape, and Kick questionnaire    Fear of Current or Ex-Partner: No    Emotionally Abused: No    Physically Abused: No    Sexually Abused: No    FAMILY  HISTORY:  Family History  Problem Relation Age of Onset   Ovarian cancer Mother    Anuerysm Father    Hypertension Brother    Healthy Daughter    Healthy Daughter    Colon cancer Neg Hx    Esophageal cancer Neg Hx    Inflammatory bowel disease Neg Hx    Liver disease Neg Hx    Pancreatic cancer Neg Hx    Rectal cancer Neg Hx    Stomach cancer Neg Hx     CURRENT MEDICATIONS:  Current Outpatient Medications  Medication Sig Dispense Refill   Ascorbic Acid (VITAMIN C) 100 MG tablet Take 100 mg by mouth daily.     cyanocobalamin 100 MCG tablet Take 100 mcg by mouth daily.     MAGNESIUM PO Take 1 tablet by mouth daily.     Multiple Vitamins-Minerals (ZINC PO) Take by mouth.     NON FORMULARY Take 1 tablet by mouth daily. Liver health     VITAMIN D PO Take 1 tablet by mouth daily.     vitamin E 1000 UNIT capsule Take 1,000 Units by mouth daily.     zinc gluconate 50 MG tablet Take 50 mg by mouth daily.     famotidine (PEPCID) 10 MG tablet Take 10 mg by mouth daily. (Patient not taking: Reported on 09/21/2022)     No current facility-administered medications for this visit.    ALLERGIES:  Allergies  Allergen Reactions   Ciprofloxacin Other (See Comments)    Patient states this causes severe muscle aching and discomfort.   Nitrofurantoin Other (See Comments)    ILD   Asa [Aspirin]     Tinnitis   Sulfa Antibiotics Nausea And Vomiting   Zithromax [Azithromycin]     Abdominal cramps    PHYSICAL EXAM:  Performance status (ECOG): 0 - Asymptomatic  There were no vitals filed for this visit.  Wt Readings from Last 3 Encounters:  09/21/22 106 lb (48.1 kg)  07/23/22 107 lb (48.5 kg)  10/13/21 155 lb 13.8 oz (70.7 kg)   Physical Exam Vitals reviewed.  Constitutional:      Appearance: Normal appearance. She is obese.  Cardiovascular:     Rate and Rhythm: Normal rate and regular rhythm.     Pulses: Normal pulses.     Heart sounds: Normal heart sounds.  Pulmonary:      Effort: Pulmonary effort is normal.     Breath sounds: Normal breath sounds.  Abdominal:     Palpations: Abdomen is soft. There is no mass.     Tenderness: There is no abdominal tenderness.  Neurological:     General: No focal deficit present.     Mental Status: She is alert and oriented to person, place, and time.  Psychiatric:        Mood and Affect: Mood normal.        Behavior: Behavior normal.     LABORATORY DATA:  I have reviewed the labs as listed.     Latest Ref Rng & Units 07/23/2022   10:14 AM 07/02/2020    1:23 PM 06/12/2020   12:49 PM  CBC  WBC 3.4 - 10.8 x10E3/uL 5.9  7.4  7.1   Hemoglobin 11.1 - 15.9 g/dL 12.5  13.7  13.7   Hematocrit 34.0 - 46.6 % 38.0  41.5  40.2   Platelets 150 - 450 x10E3/uL 222  243  242.0       Latest Ref Rng & Units 10/08/2022    8:53 AM 07/23/2022   10:14 AM 10/02/2021    8:14 AM  CMP  Glucose 70 - 99 mg/dL  80    BUN 8 - 27 mg/dL  18    Creatinine 0.57 - 1.00 mg/dL  0.68    Sodium 134 - 144 mmol/L  138    Potassium 3.5 - 5.2 mmol/L  4.4    Chloride 96 - 106 mmol/L  102    CO2 20 - 29 mmol/L  25    Calcium 8.7 - 10.3 mg/dL  9.5    Total Protein 6.5 - 8.1 g/dL 7.1  6.7  7.4   Total Bilirubin 0.3 - 1.2 mg/dL 1.2  0.9  1.1   Alkaline Phos 38 - 126 U/L 65  78  68   AST 15 - 41 U/L 26  23  25   $ ALT 0 - 44 U/L 20  17  18     $ DIAGNOSTIC IMAGING:  I have independently reviewed the scans and discussed with the patient. MR ABDOMEN MRCP W WO CONTAST  Result Date: 10/08/2022 CLINICAL DATA:  Follow-up abnormal left lobe of the liver. EXAM: MRI ABDOMEN WITHOUT AND WITH CONTRAST (INCLUDING MRCP) TECHNIQUE: Multiplanar multisequence MR imaging of the abdomen was performed both before and after the administration of intravenous contrast. Heavily T2-weighted images of the biliary and pancreatic ducts were obtained, and three-dimensional MRCP images were rendered by post processing. CONTRAST:  77m GADAVIST GADOBUTROL 1 MMOL/ML IV SOLN COMPARISON:   10/12/2021 FINDINGS: Lower chest: No acute abnormality. Large hiatal hernia with intrathoracic position of the gastric body and fundus. Hepatobiliary: Unchanged, severely atrophic appearance of the left lobe of the liver with mild intrahepatic biliary ductal dilatation. Normal appearance of the right lobe of the liver. No mass or suspicious contrast enhancement status post cholecystectomy. Mild postoperative biliary ductal dilatation. Pancreas: Unremarkable. No pancreatic ductal dilatation or surrounding inflammatory changes. Spleen: Normal in size without significant abnormality. Adrenals/Urinary Tract: Adrenal glands are unremarkable. Kidneys are normal, without renal calculi, solid lesion, or hydronephrosis. Stomach/Bowel: Stomach is within normal limits. No evidence of bowel wall thickening, distention, or inflammatory changes. Vascular/Lymphatic: No significant vascular findings are present. No enlarged abdominal lymph nodes. Other: No abdominal wall hernia or abnormality. No ascites. Musculoskeletal: No acute or significant osseous findings. IMPRESSION: 1. Unchanged, severely atrophic appearance of the left lobe of the liver with mild intrahepatic biliary ductal dilatation. Normal appearance of the right lobe of the liver. No mass or suspicious contrast enhancement status post cholecystectomy. 2. Large hiatal hernia with intrathoracic position of the gastric body and fundus. Electronically Signed   By: ADelanna AhmadiM.D.   On: 10/08/2022 21:48   MR 3D Recon At Scanner  Result Date: 10/08/2022 CLINICAL DATA:  Follow-up abnormal left lobe  of the liver. EXAM: MRI ABDOMEN WITHOUT AND WITH CONTRAST (INCLUDING MRCP) TECHNIQUE: Multiplanar multisequence MR imaging of the abdomen was performed both before and after the administration of intravenous contrast. Heavily T2-weighted images of the biliary and pancreatic ducts were obtained, and three-dimensional MRCP images were rendered by post processing. CONTRAST:   60m GADAVIST GADOBUTROL 1 MMOL/ML IV SOLN COMPARISON:  10/12/2021 FINDINGS: Lower chest: No acute abnormality. Large hiatal hernia with intrathoracic position of the gastric body and fundus. Hepatobiliary: Unchanged, severely atrophic appearance of the left lobe of the liver with mild intrahepatic biliary ductal dilatation. Normal appearance of the right lobe of the liver. No mass or suspicious contrast enhancement status post cholecystectomy. Mild postoperative biliary ductal dilatation. Pancreas: Unremarkable. No pancreatic ductal dilatation or surrounding inflammatory changes. Spleen: Normal in size without significant abnormality. Adrenals/Urinary Tract: Adrenal glands are unremarkable. Kidneys are normal, without renal calculi, solid lesion, or hydronephrosis. Stomach/Bowel: Stomach is within normal limits. No evidence of bowel wall thickening, distention, or inflammatory changes. Vascular/Lymphatic: No significant vascular findings are present. No enlarged abdominal lymph nodes. Other: No abdominal wall hernia or abnormality. No ascites. Musculoskeletal: No acute or significant osseous findings. IMPRESSION: 1. Unchanged, severely atrophic appearance of the left lobe of the liver with mild intrahepatic biliary ductal dilatation. Normal appearance of the right lobe of the liver. No mass or suspicious contrast enhancement status post cholecystectomy. 2. Large hiatal hernia with intrathoracic position of the gastric body and fundus. Electronically Signed   By: ADelanna AhmadiM.D.   On: 10/08/2022 21:48     ASSESSMENT:  1.  Mass of the left lobe of the liver: -CT scan of the chest on 04/09/2020 showed mass in the left hepatic lobe with atrophy of the same lobe. -MRI of the abdomen on 04/22/2020 showed a new mass in the left liver lobe since July 2019, abnormal signal and enhancement with possible intrahepatic duct dilatation, suspicious for cholangiocarcinoma.  No abdominal adenopathy. -She denies any fevers,  night sweats or weight loss. -LFTs on 04/21/2020 shows mildly elevated total bilirubin of 1.4. -PET scan on 05/08/2020 shows no hypermetabolic activity in the left hepatic lobe.  Large hiatal hernia. -MRCP on 05/30/2020 shows diffuse atrophy of the left hepatic lobe with intrahepatic biliary ductal dilatation with no focal hepatic mass identified. -CA 19-9 is elevated at 88.  AFP and CEA is normal. - She had gallbladder surgery that was done 20 years ago which could have contributed to the atrophy of the left hepatic lobe.   2.  Social/family history: -Works as a pPrint production planner  Never smoker. -Mother had ovarian cancer in her 624sand father had prostate cancer.  Paternal grandfather had cirrhosis   PLAN:  1.  Intrahepatic biliary ductal dilatation: - She does not have any GI symptoms or abdominal pain. - Reviewed LFTs from 10/08/2022 which were normal.  CA 19-9 was normal at 31. - MRI abdomen showed unchanged severe atrophic appearance of the left lobe of the liver with mild intrahepatic biliary ductal dilatation.  Normal appearance of right lobe of the liver.  No mass or suspicious lesions.  Large hiatal hernia. - Her labs and scans are stable since August 2021. - I will discharge her from my clinic.  I will see her on as-needed basis.  I have recommended that she have LFTs and CA 19-9 checked with her PMD in 1 year.    Orders placed this encounter:  No orders of the defined types were placed in this encounter.  Derek Jack, MD [facility] [phone]

## 2022-10-20 NOTE — Patient Instructions (Signed)
[facility]  Discharge Instructions  You were seen and examined today by Dr. Delton Coombes.  Dr. Delton Coombes discussed your most recent lab work and CT scan which revealed that everything looks really good.  Have your primary care check CA 19-9.  Follow-up as needed.    Thank you for choosing [facility] to provide your oncology and hematology care.   To afford each patient quality time with our provider, please arrive at least 15 minutes before your scheduled appointment time. You may need to reschedule your appointment if you arrive late (10 or more minutes). Arriving late affects you and other patients whose appointments are after yours.  Also, if you miss three or more appointments without notifying the office, you may be dismissed from the clinic at the provider's discretion.    Again, thank you for choosing [facility].  Our hope is that these requests will decrease the amount of time that you wait before being seen by our physicians.   If you have a lab appointment with the [facility] please come in thru the Main Entrance and check in at the main information desk.           _____________________________________________________________  Should you have questions after your visit to [facility], please contact our office at [phone] and follow the prompts.  Our office hours are 8:00 a.m. to 4:30 p.m. Monday - Thursday and 8:00 a.m. to 2:30 p.m. Friday.  Please note that voicemails left after 4:00 p.m. may not be returned until the following business day.  We are closed weekends and all major holidays.  You do have access to a nurse 24-7, just call the main number to the clinic [phone] and do not press any options, hold on the line and a nurse will answer the phone.    For prescription refill requests, have your pharmacy contact our office and allow 72 hours.    Masks are optional in the cancer  centers. If you would like for your care team to wear a mask while they are taking care of you, please let them know. You may have one support person who is at least 67 years old accompany you for your appointments.

## 2022-11-03 ENCOUNTER — Other Ambulatory Visit: Payer: Self-pay | Admitting: Nurse Practitioner

## 2022-11-03 DIAGNOSIS — Z1231 Encounter for screening mammogram for malignant neoplasm of breast: Secondary | ICD-10-CM

## 2022-11-10 ENCOUNTER — Telehealth: Payer: Self-pay | Admitting: Nurse Practitioner

## 2022-11-10 ENCOUNTER — Encounter: Payer: Self-pay | Admitting: Family Medicine

## 2022-11-10 ENCOUNTER — Ambulatory Visit (INDEPENDENT_AMBULATORY_CARE_PROVIDER_SITE_OTHER): Payer: Medicare Other | Admitting: Family Medicine

## 2022-11-10 ENCOUNTER — Other Ambulatory Visit: Payer: Self-pay | Admitting: Family Medicine

## 2022-11-10 VITALS — BP 135/78 | HR 71 | Temp 97.6°F | Ht <= 58 in | Wt 108.8 lb

## 2022-11-10 DIAGNOSIS — N3001 Acute cystitis with hematuria: Secondary | ICD-10-CM | POA: Diagnosis not present

## 2022-11-10 DIAGNOSIS — R310 Gross hematuria: Secondary | ICD-10-CM

## 2022-11-10 DIAGNOSIS — R319 Hematuria, unspecified: Secondary | ICD-10-CM | POA: Diagnosis not present

## 2022-11-10 LAB — URINALYSIS, ROUTINE W REFLEX MICROSCOPIC
Bilirubin, UA: NEGATIVE
Glucose, UA: NEGATIVE
Ketones, UA: NEGATIVE
Nitrite, UA: NEGATIVE
Protein,UA: NEGATIVE
Specific Gravity, UA: 1.015 (ref 1.005–1.030)
Urobilinogen, Ur: 0.2 mg/dL (ref 0.2–1.0)
pH, UA: 7.5 (ref 5.0–7.5)

## 2022-11-10 LAB — MICROSCOPIC EXAMINATION
Epithelial Cells (non renal): NONE SEEN /hpf (ref 0–10)
RBC, Urine: NONE SEEN /hpf (ref 0–2)
Renal Epithel, UA: NONE SEEN /hpf

## 2022-11-10 MED ORDER — FOSFOMYCIN TROMETHAMINE 3 G PO PACK: 3.0000 g | PACK | Freq: Once | ORAL | 0 refills | Status: DC

## 2022-11-10 NOTE — Progress Notes (Signed)
Subjective:  Patient ID: Amber Winters, female    DOB: 12-13-1955, 67 y.o.   MRN: [id number]  Patient Care Team: Chevis Pretty, [place] as PCP - General (Nurse Practitioner) Brien Mates, RN as Oncology Nurse Navigator (Oncology) Lavonna Monarch, MD (Inactive) as Consulting Physician (Dermatology)   Chief Complaint:  Hematuria (First noticed this morning. )   HPI: Amber Winters is a 67 y.o. female presenting on 11/10/2022 for Hematuria (First noticed this morning. )   Recurrent UTI symptoms. States she has a cystocele and rectocele but this feels different. She was recently on Augmentin for a suspected UTI.   Hematuria This is a new problem. The current episode started yesterday. The problem is unchanged. She describes the hematuria as gross hematuria. She reports no clotting in her urine stream. The pain is mild. She describes her urine color as light pink. Irritative symptoms include frequency and urgency. Obstructive symptoms do not include dribbling, incomplete emptying, an intermittent stream, a slower stream, straining or a weak stream. Associated symptoms include dysuria. Pertinent negatives include no abdominal pain, bladder pain, bone pain, chills, facial swelling, fever, flank pain, genital pain, hematospermia, hesitancy, inability to urinate, nausea, urinary retention, vomiting or weight loss. She is not sexually active.     Relevant past medical, surgical, family, and social history reviewed and updated as indicated.  Allergies and medications reviewed and updated. Data reviewed: Chart in Epic.   Past Medical History:  Diagnosis Date   Hyperlipidemia    Squamous cell carcinoma of skin 07/07/2021   MOHs was done by Dr. Winifred [facility]    Past Surgical History:  Procedure Laterality Date   ABDOMINAL HYSTERECTOMY  2000   BRONCHIAL WASHINGS  11/12/2019   Procedure: BRONCHIAL WASHINGS;  Surgeon: Brand Males, MD;  Location: [facility];  Service:  Endoscopy;;   CATARACT EXTRACTION, BILATERAL     CHOLECYSTECTOMY  2001   HERNIA REPAIR     URETHRAL SLING     VIDEO BRONCHOSCOPY  11/12/2019   Procedure: VIDEO BRONCHOSCOPY WITH FLUORO;  Surgeon: Brand Males, MD;  Location: [facility];  Service: Endoscopy;;    Social History   Socioeconomic History   Marital status: Married    Spouse name: Not on file   Number of children: 2   Years of education: Not on file   Highest education level: Not on file  Occupational History   Not on file  Tobacco Use   Smoking status: Never   Smokeless tobacco: Never  Vaping Use   Vaping Use: Never used  Substance and Sexual Activity   Alcohol use: No   Drug use: No   Sexual activity: Not Currently  Other Topics Concern   Not on file  Social History Narrative   Not on file   Social Determinants of Health   Financial Resource Strain: Low Risk  (09/21/2022)   Overall Financial Resource Strain (CARDIA)    Difficulty of Paying Living Expenses: Not hard at all  Food Insecurity: No Food Insecurity (09/21/2022)   Hunger Vital Sign    Worried About Running Out of Food in the Last Year: Never true    Ran Out of Food in the Last Year: Never true  Transportation Needs: No Transportation Needs (09/21/2022)   PRAPARE - Hydrologist (Medical): No    Lack of Transportation (Non-Medical): No  Physical Activity: Insufficiently Active (09/21/2022)   Exercise Vital Sign    Days of Exercise per  Week: 3 days    Minutes of Exercise per Session: 30 min  Stress: No Stress Concern Present (09/21/2022)   [place]    Feeling of Stress : Not at all  Social Connections: [place] (09/21/2022)   Social Connection and Isolation Panel [NHANES]    Frequency of Communication with Friends and Family: More than three times a week    Frequency of Social Gatherings with Friends and Family: More than three times a  week    Attends Religious Services: More than 4 times per year    Active Member of [employer] or Organizations: Yes    Attends Music therapist: More than 4 times per year    Marital Status: Married  Human resources officer Violence: Not At Risk (09/21/2022)   Humiliation, Afraid, Rape, and Kick questionnaire    Fear of Current or Ex-Partner: No    Emotionally Abused: No    Physically Abused: No    Sexually Abused: No    Outpatient Encounter Medications as of 11/10/2022  Medication Sig   Ascorbic Acid (VITAMIN C) 100 MG tablet Take 100 mg by mouth daily.   cephALEXin (KEFLEX) 250 MG capsule Take 250 mg by mouth daily.   cyanocobalamin 100 MCG tablet Take 100 mcg by mouth daily.   fosfomycin (MONUROL) 3 g PACK Take 3 g by mouth once for 1 dose.   MAGNESIUM PO Take 1 tablet by mouth daily.   Multiple Vitamins-Minerals (ZINC PO) Take by mouth.   NON FORMULARY Take 1 tablet by mouth daily. Liver health   VITAMIN D PO Take 1 tablet by mouth daily.   vitamin E 1000 UNIT capsule Take 1,000 Units by mouth daily.   zinc gluconate 50 MG tablet Take 50 mg by mouth daily.   [DISCONTINUED] cephALEXin (KEFLEX) 500 MG capsule Take 500 mg by mouth daily at 2 PM.   No facility-administered encounter medications on file as of 11/10/2022.    Allergies  Allergen Reactions   Ciprofloxacin Other (See Comments)    Patient states this causes severe muscle aching and discomfort.   Nitrofurantoin Other (See Comments)    ILD   Asa [Aspirin]     Tinnitis   Sulfa Antibiotics Nausea And Vomiting   Zithromax [Azithromycin]     Abdominal cramps    Review of Systems  Constitutional:  Negative for activity change, appetite change, chills, diaphoresis, fatigue, fever, unexpected weight change and weight loss.  HENT:  Negative for facial swelling.   Respiratory:  Negative for cough and shortness of breath.   Cardiovascular:  Negative for chest pain, palpitations and leg swelling.  Gastrointestinal:   Negative for abdominal distention, abdominal pain, anal bleeding, blood in stool, constipation, diarrhea, nausea, rectal pain and vomiting.  Genitourinary:  Positive for dysuria, frequency, hematuria and urgency. Negative for decreased urine volume, difficulty urinating, enuresis, flank pain, genital sores, hesitancy and incomplete emptying.  Musculoskeletal:  Negative for back pain.  Neurological:  Negative for weakness.  Psychiatric/Behavioral:  Negative for confusion.   All other systems reviewed and are negative.       Objective:  BP 135/78   Pulse 71   Temp 97.6 F (36.4 C) (Temporal)   Ht 4\' 10"  (1.473 m)   Wt 108 lb 12.8 oz (49.4 kg)   SpO2 100%   BMI 22.74 kg/m    Wt Readings from Last 3 Encounters:  11/10/22 108 lb 12.8 oz (49.4 kg)  10/20/22 110 lb 6.4 oz (  50.1 kg)  09/21/22 106 lb (48.1 kg)    Physical Exam Vitals and nursing note reviewed.  Constitutional:      General: She is not in acute distress.    Appearance: Normal appearance. She is well-developed, well-groomed and normal weight. She is not ill-appearing, toxic-appearing or diaphoretic.  HENT:     Head: Normocephalic and atraumatic.     Jaw: There is normal jaw occlusion.     Right Ear: Hearing normal.     Left Ear: Hearing normal.     Nose: Nose normal.     Mouth/Throat:     Lips: Pink.     Mouth: Mucous membranes are moist.     Pharynx: Oropharynx is clear. Uvula midline.  Eyes:     General: Lids are normal.     Extraocular Movements: Extraocular movements intact.     Conjunctiva/sclera: Conjunctivae normal.     Pupils: Pupils are equal, round, and reactive to light.  Neck:     Trachea: Trachea and phonation normal.  Cardiovascular:     Rate and Rhythm: Normal rate and regular rhythm.     Chest Wall: PMI is not displaced.     Heart sounds: Normal heart sounds. No murmur heard.    No friction rub. No gallop.  Pulmonary:     Effort: Pulmonary effort is normal. No respiratory distress.      Breath sounds: Normal breath sounds. No wheezing.  Abdominal:     General: Bowel sounds are normal. There is no distension or abdominal bruit.     Palpations: Abdomen is soft. There is no hepatomegaly or splenomegaly.     Tenderness: There is no abdominal tenderness. There is no right CVA tenderness or left CVA tenderness.     Hernia: No hernia is present.  Musculoskeletal:        General: Normal range of motion.     Right lower leg: No edema.     Left lower leg: No edema.  Skin:    General: Skin is warm and dry.     Capillary Refill: Capillary refill takes less than 2 seconds.     Coloration: Skin is not cyanotic, jaundiced or pale.     Findings: No rash.  Neurological:     General: No focal deficit present.     Mental Status: She is alert and oriented to person, place, and time.     Sensory: Sensation is intact.     Motor: Motor function is intact.     Coordination: Coordination is intact.     Gait: Gait is intact.     Deep Tendon Reflexes: Reflexes are normal and symmetric.  Psychiatric:        Attention and Perception: Attention and perception normal.        Mood and Affect: Mood and affect normal.        Speech: Speech normal.        Behavior: Behavior normal. Behavior is cooperative.        Thought Content: Thought content normal.        Cognition and Memory: Cognition and memory normal.        Judgment: Judgment normal.     Results for orders placed or performed in visit on 10/08/22  Hepatic function panel  Result Value Ref Range   Total Protein 7.1 6.5 - 8.1 g/dL   Albumin 4.0 3.5 - 5.0 g/dL   AST 26 15 - 41 U/L   ALT 20 0 - 44 U/L  Alkaline Phosphatase 65 38 - 126 U/L   Total Bilirubin 1.2 0.3 - 1.2 mg/dL   Bilirubin, Direct 0.1 0.0 - 0.2 mg/dL   Indirect Bilirubin 1.1 (H) 0.3 - 0.9 mg/dL  Cancer antigen 19-9  Result Value Ref Range   CA 19-9 31 0 - 35 U/mL       Pertinent labs & imaging results that were available during my care of the patient were  reviewed by me and considered in my medical decision making.  Assessment & Plan:  Hendrix was seen today for hematuria.  Diagnoses and all orders for this visit:  Gross hematuria Urinalysis as noted. Culture pending.  -     Urinalysis, Routine w reflex microscopic -     Urine Culture  Acute cystitis with hematuria Pt has several allergies to antibiotics and was recently on Augmentin for a suspected UTI. She is on Keflex daily for prophylaxis. Will treat with below, will adjust treatment if culture warrants. Symptomatic care discussed in detail. Aware to report new, worsening, or persistent symptoms.  -     fosfomycin (MONUROL) 3 g PACK; Take 3 g by mouth once for 1 dose.     Continue all other maintenance medications.  Follow up plan: Return if symptoms worsen or fail to improve.   Continue healthy lifestyle choices, including diet (rich in fruits, vegetables, and lean proteins, and low in salt and simple carbohydrates) and exercise (at least 30 minutes of moderate physical activity daily).  Educational handout given for UTI  The above assessment and management plan was discussed with the patient. The patient verbalized understanding of and has agreed to the management plan. Patient is aware to call the clinic if they develop any new symptoms or if symptoms persist or worsen. Patient is aware when to return to the clinic for a follow-up visit. Patient educated on when it is appropriate to go to the emergency department.   Monia Pouch, FNP-C [facility] [phone]

## 2022-11-11 MED ORDER — AMOXICILLIN-POT CLAVULANATE 875-125 MG PO TABS: 1.0000 | ORAL_TABLET | Freq: Two times a day (BID) | ORAL | 0 refills | Status: DC

## 2022-11-11 NOTE — Addendum Note (Signed)
Addended by: Baruch Gouty on: 11/11/2022 08:00 AM   Modules accepted: Orders

## 2022-11-11 NOTE — Telephone Encounter (Signed)
Patient aware.

## 2022-11-11 NOTE — Telephone Encounter (Signed)
Please advise 

## 2022-11-11 NOTE — Telephone Encounter (Signed)
Name from pharmacy: FOSFOMYCIN 3 [facility]   Pharmacy comment: Alternative Requested:INSURANCE WILL NOT PAY FOR THIS MEDICINE. PLEASE SEND IN AN ALTERNATIVE. THANKS!

## 2022-11-12 LAB — URINE CULTURE

## 2022-11-29 ENCOUNTER — Ambulatory Visit
Admission: RE | Admit: 2022-11-29 | Discharge: 2022-11-29 | Disposition: A | Discharge status: Home / Self Care | Payer: Medicare Other | Source: Ambulatory Visit | Attending: Nurse Practitioner | Admitting: Nurse Practitioner

## 2022-11-29 DIAGNOSIS — Z1231 Encounter for screening mammogram for malignant neoplasm of breast: Secondary | ICD-10-CM

## 2022-12-03 DIAGNOSIS — N390 Urinary tract infection, site not specified: Secondary | ICD-10-CM | POA: Diagnosis not present

## 2022-12-03 DIAGNOSIS — R350 Frequency of micturition: Secondary | ICD-10-CM | POA: Diagnosis not present

## 2022-12-20 DIAGNOSIS — L568 Other specified acute skin changes due to ultraviolet radiation: Secondary | ICD-10-CM | POA: Diagnosis not present

## 2022-12-20 DIAGNOSIS — L821 Other seborrheic keratosis: Secondary | ICD-10-CM | POA: Diagnosis not present

## 2022-12-20 DIAGNOSIS — Z08 Encounter for follow-up examination after completed treatment for malignant neoplasm: Secondary | ICD-10-CM | POA: Diagnosis not present

## 2022-12-20 DIAGNOSIS — Z85828 Personal history of other malignant neoplasm of skin: Secondary | ICD-10-CM | POA: Diagnosis not present

## 2022-12-20 DIAGNOSIS — Z1283 Encounter for screening for malignant neoplasm of skin: Secondary | ICD-10-CM | POA: Diagnosis not present

## 2023-11-29 ENCOUNTER — Other Ambulatory Visit: Payer: Self-pay | Admitting: Nurse Practitioner

## 2023-11-29 DIAGNOSIS — Z1231 Encounter for screening mammogram for malignant neoplasm of breast: Secondary | ICD-10-CM

## 2024-01-02 ENCOUNTER — Ambulatory Visit
Admission: RE | Admit: 2024-01-02 | Discharge: 2024-01-02 | Disposition: A | Discharge status: Home / Self Care | Source: Ambulatory Visit | Attending: Nurse Practitioner | Admitting: Nurse Practitioner

## 2024-01-02 DIAGNOSIS — Z1231 Encounter for screening mammogram for malignant neoplasm of breast: Secondary | ICD-10-CM

## 2024-01-06 DIAGNOSIS — R350 Frequency of micturition: Secondary | ICD-10-CM | POA: Diagnosis not present

## 2024-01-06 DIAGNOSIS — N390 Urinary tract infection, site not specified: Secondary | ICD-10-CM | POA: Diagnosis not present

## 2024-07-03 ENCOUNTER — Encounter: Payer: Self-pay | Admitting: Nurse Practitioner

## 2024-07-03 NOTE — Progress Notes (Deleted)
 Subjective:    Patient ID: Amber Winters, female    DOB: [birth year], 68 y.o.   MRN: [id number]   Chief Complaint: medical management of chronic issues     HPI:  Amber Winters is a 68 y.o. who identifies as a female who was assigned female at birth.   Social history: Lives with: husband Work history: retired from school system   Comes in today for follow up of the following chronic medical issues:  1. Annual physical exam No pap today  2. ILD (interstitial lung disease) (HCC) Says that her breathing is good. No issues  3. Vitamin D  deficiency Is on daily vitamin d  supplement   New complaints: None today  Allergies  Allergen Reactions   Ciprofloxacin  Other (See Comments)    Patient states this causes severe muscle aching and discomfort.   Nitrofurantoin  Other (See Comments)    ILD   Asa [Aspirin]     Tinnitis   Sulfa  Antibiotics Nausea And Vomiting   Zithromax [Azithromycin]     Abdominal cramps   Outpatient Encounter Medications as of 07/03/2024  Medication Sig   amoxicillin -clavulanate (AUGMENTIN ) 875-125 MG tablet Take 1 tablet by mouth 2 (two) times daily.   Ascorbic Acid (VITAMIN C) 100 MG tablet Take 100 mg by mouth daily.   cephALEXin  (KEFLEX ) 250 MG capsule Take 250 mg by mouth daily.   cyanocobalamin 100 MCG tablet Take 100 mcg by mouth daily.   MAGNESIUM  PO Take 1 tablet by mouth daily.   Multiple Vitamins-Minerals (ZINC PO) Take by mouth.   NON FORMULARY Take 1 tablet by mouth daily. Liver health   VITAMIN D  PO Take 1 tablet by mouth daily.   vitamin E 1000 UNIT capsule Take 1,000 Units by mouth daily.   zinc gluconate 50 MG tablet Take 50 mg by mouth daily.   No facility-administered encounter medications on file as of 07/03/2024.    Past Surgical History:  Procedure Laterality Date   ABDOMINAL HYSTERECTOMY  2000   BRONCHIAL WASHINGS  11/12/2019   Procedure: BRONCHIAL WASHINGS;  Surgeon: Geronimo Amel, MD;  Location: [facility];  Service:  Endoscopy;;   CATARACT EXTRACTION, BILATERAL     CHOLECYSTECTOMY  2001   HERNIA REPAIR     URETHRAL SLING     VIDEO BRONCHOSCOPY  11/12/2019   Procedure: VIDEO BRONCHOSCOPY WITH FLUORO;  Surgeon: Geronimo Amel, MD;  Location: [facility];  Service: Endoscopy;;    Family History  Problem Relation Age of Onset   Ovarian cancer Mother    Anuerysm Father    Healthy Daughter    Healthy Daughter    Hypertension Brother    Colon cancer Neg Hx    Esophageal cancer Neg Hx    Inflammatory bowel disease Neg Hx    Liver disease Neg Hx    Pancreatic cancer Neg Hx    Rectal cancer Neg Hx    Stomach cancer Neg Hx    Breast cancer Neg Hx       Controlled substance contract: n/a     Review of Systems  Constitutional:  Negative for diaphoresis.  Eyes:  Negative for pain.  Respiratory:  Negative for shortness of breath.   Cardiovascular:  Negative for chest pain, palpitations and leg swelling.  Gastrointestinal:  Negative for abdominal pain.  Endocrine: Negative for polydipsia.  Skin:  Negative for rash.  Neurological:  Negative for dizziness, weakness and headaches.  Hematological:  Does not bruise/bleed easily.  All other systems reviewed and are negative.  Objective:   Physical Exam Vitals and nursing note reviewed.  Constitutional:      General: She is not in acute distress.    Appearance: Normal appearance. She is well-developed.  HENT:     Head: Normocephalic.     Right Ear: Tympanic membrane normal.     Left Ear: Tympanic membrane normal.     Nose: Nose normal.     Mouth/Throat:     Mouth: Mucous membranes are moist.  Eyes:     Pupils: Pupils are equal, round, and reactive to light.  Neck:     Vascular: No carotid bruit or JVD.  Cardiovascular:     Rate and Rhythm: Normal rate and regular rhythm.     Heart sounds: Normal heart sounds.  Pulmonary:     Effort: Pulmonary effort is normal. No respiratory distress.     Breath sounds: Normal breath sounds.  No wheezing or rales.  Chest:     Chest wall: No tenderness.  Abdominal:     General: Bowel sounds are normal. There is no distension or abdominal bruit.     Palpations: Abdomen is soft. There is no hepatomegaly, splenomegaly, mass or pulsatile mass.     Tenderness: There is no abdominal tenderness.  Musculoskeletal:        General: Normal range of motion.     Cervical back: Normal range of motion and neck supple.  Lymphadenopathy:     Cervical: No cervical adenopathy.  Skin:    General: Skin is warm and dry.  Neurological:     Mental Status: She is alert and oriented to person, place, and time.     Deep Tendon Reflexes: Reflexes are normal and symmetric.  Psychiatric:        Behavior: Behavior normal.        Thought Content: Thought content normal.        Judgment: Judgment normal.    There were no vitals taken for this visit.        Assessment & Plan:   Amber Winters comes in today with chief complaint of No chief complaint on file.   Diagnosis and orders addressed:  1. Annual physical exam  - DG Chest 2 View - EKG 12-Lead - CBC with Differential/Platelet - CMP14+EGFR - Lipid panel - Thyroid  Panel With TSH  2. ILD (interstitial lung disease) (HCC) Report any breathing issues  3. Vitamin D  deficiency Continue vitamin d  supplement - VITAMIN D  25 Hydroxy (Vit-D Deficiency, Fractures)   Labs pending Health Maintenance reviewed Diet and exercise encouraged  Follow up plan: 6 months   Mary-Margaret Gladis, FNP

## 2024-07-04 ENCOUNTER — Ambulatory Visit: Payer: Self-pay | Admitting: Family Medicine

## 2024-07-04 ENCOUNTER — Encounter: Payer: Self-pay | Admitting: Family Medicine

## 2024-07-04 ENCOUNTER — Telehealth (INDEPENDENT_AMBULATORY_CARE_PROVIDER_SITE_OTHER): Admitting: Family Medicine

## 2024-07-04 DIAGNOSIS — N3001 Acute cystitis with hematuria: Secondary | ICD-10-CM

## 2024-07-04 DIAGNOSIS — R300 Dysuria: Secondary | ICD-10-CM | POA: Diagnosis not present

## 2024-07-04 LAB — MICROSCOPIC EXAMINATION
Epithelial Cells (non renal): NONE SEEN /HPF (ref 0–10)
RBC, Urine: NONE SEEN /HPF (ref 0–2)
Renal Epithel, UA: NONE SEEN /HPF
WBC, UA: >30 /HPF — AB (ref 0–5)
Yeast, UA: NONE SEEN

## 2024-07-04 LAB — URINALYSIS, COMPLETE
Bilirubin, UA: NEGATIVE
Glucose, UA: NEGATIVE
Ketones, UA: NEGATIVE
Nitrite, UA: NEGATIVE
Protein,UA: NEGATIVE
Specific Gravity, UA: <=1.005 — AB (ref 1.005–1.030)
Urobilinogen, Ur: 0.2 mg/dL (ref 0.2–1.0)
pH, UA: 5.5 (ref 5.0–7.5)

## 2024-07-04 MED ORDER — AMOXICILLIN-POT CLAVULANATE 875-125 MG PO TABS: 1.0000 | ORAL_TABLET | Freq: Two times a day (BID) | ORAL | 0 refills | Status: DC

## 2024-07-04 NOTE — Progress Notes (Signed)
 Virtual Visit via Video   I connected with patient on 07/04/24 at 1500 by a video enabled telemedicine application and verified that I am speaking with the correct person using two identifiers.  Location patient: Home Location provider: [facility] Persons participating in the virtual visit: Patient and Provider  I discussed the limitations of evaluation and management by telemedicine and the availability of in person appointments. The patient expressed understanding and agreed to proceed.  Subjective:   HPI:  Pt presents today for  Chief Complaint  Patient presents with   Dysuria    Amber Winters is a 68 year old female who presents with symptoms of a urinary tract infection.  Lower urinary tract symptoms - Onset of symptoms on Tuesday, July 03, 2024, while at the airport - Symptoms consistent with urinary tract infection - No mention of fever, flank pain, or systemic symptoms  Bladder irritants and behavioral modifications - Avoids caffeine due to bladder irritation  Recent travel and contributing factors - Recent travel to [place]  with granddaughter - Extensive walking and limited bathroom access during travel       ROS per HPI   Patient Active Problem List   Diagnosis Date Noted   Plantar fasciitis, right 09/06/2021   Elevated CA 19-9 level 06/19/2021   Bile duct abnormality 12/14/2020   Intrahepatic bile duct dilation 06/16/2020   Abnormal MRI, liver 06/16/2020   Liver lesion, left lobe 06/16/2020   Common bile duct dilation 06/16/2020   Hiatal hernia 06/16/2020   Nitrofurantoin  adverse reaction, initial encounter 11/16/2019   ILD (interstitial lung disease) (HCC)    Vitamin D  deficiency 04/01/2016    Social History   Tobacco Use   Smoking status: Never   Smokeless tobacco: Never  Substance Use Topics   Alcohol use: No    Current Outpatient Medications:    amoxicillin -clavulanate (AUGMENTIN ) 875-125 MG tablet,  Take 1 tablet by mouth 2 (two) times daily for 7 days., Disp: 14 tablet, Rfl: 0   Ascorbic Acid (VITAMIN C) 100 MG tablet, Take 100 mg by mouth daily., Disp: , Rfl:    cephALEXin  (KEFLEX ) 250 MG capsule, Take 250 mg by mouth daily., Disp: , Rfl:    cyanocobalamin 100 MCG tablet, Take 100 mcg by mouth daily., Disp: , Rfl:    MAGNESIUM  PO, Take 1 tablet by mouth daily., Disp: , Rfl:    Multiple Vitamins-Minerals (ZINC PO), Take by mouth., Disp: , Rfl:    NON FORMULARY, Take 1 tablet by mouth daily. Liver health, Disp: , Rfl:    VITAMIN D  PO, Take 1 tablet by mouth daily., Disp: , Rfl:    vitamin E 1000 UNIT capsule, Take 1,000 Units by mouth daily., Disp: , Rfl:    zinc gluconate 50 MG tablet, Take 50 mg by mouth daily., Disp: , Rfl:   Allergies  Allergen Reactions   Ciprofloxacin  Other (See Comments)    Patient states this causes severe muscle aching and discomfort.   Nitrofurantoin  Other (See Comments)    ILD   Asa [Aspirin]     Tinnitis   Sulfa  Antibiotics Nausea And Vomiting   Zithromax [Azithromycin]     Abdominal cramps    Objective:   There were no vitals taken for this visit.  Patient is well-developed, well-nourished in no acute distress.  Resting comfortably at home.  Head is normocephalic, atraumatic.  No labored breathing.  Speech is clear and coherent with logical content.  Patient is alert and oriented  at baseline.    Assessment and Plan:   Amber Winters was seen today for dysuria.  Diagnoses and all orders for this visit:  Dysuria -     Urinalysis, Complete -     Urine Culture  Acute cystitis with hematuria -     amoxicillin -clavulanate (AUGMENTIN ) 875-125 MG tablet; Take 1 tablet by mouth 2 (two) times daily for 7 days.       Acute cystitis with hematuria Recurrent urinary tract infection with hematuria. Previous treatment with Augmentin  was effective. Current episode identified early with no nitrites present. Culture performed to ensure antibiotic coverage. -  Prescribed Augmentin  for current infection. - Advised to increase water intake and avoid caffeine to prevent bladder irritation. - Will contact if culture results indicate need for antibiotic change.        Return if symptoms worsen or fail to improve.  Amber Bruns, FNP-C [facility] [address] [place], [place] [address] [phone]  07/04/2024  Time spent with the patient: 1 minutes, of which >50% was spent in obtaining information about symptoms, reviewing previous labs, evaluations, and treatments, counseling about condition (please see the discussed topics above), and developing a plan to further investigate it; had a number of questions which I addressed.

## 2024-07-07 LAB — URINE CULTURE

## 2024-07-07 MED ORDER — DOXYCYCLINE MONOHYDRATE 100 MG PO TABS: 100.0000 mg | ORAL_TABLET | Freq: Two times a day (BID) | ORAL | 0 refills | Status: AC

## 2024-07-27 ENCOUNTER — Encounter: Admitting: Nurse Practitioner

## 2024-07-27 ENCOUNTER — Ambulatory Visit: Admitting: Nurse Practitioner

## 2024-07-27 VITALS — BP 129/80 | HR 67 | Temp 96.6°F | Ht <= 58 in | Wt 129.0 lb

## 2024-07-27 DIAGNOSIS — E559 Vitamin D deficiency, unspecified: Secondary | ICD-10-CM

## 2024-07-27 DIAGNOSIS — R7989 Other specified abnormal findings of blood chemistry: Secondary | ICD-10-CM

## 2024-07-27 DIAGNOSIS — Z0000 Encounter for general adult medical examination without abnormal findings: Secondary | ICD-10-CM

## 2024-07-27 DIAGNOSIS — J849 Interstitial pulmonary disease, unspecified: Secondary | ICD-10-CM

## 2024-07-27 LAB — MICROSCOPIC EXAMINATION
Bacteria, UA: NONE SEEN
RBC, Urine: NONE SEEN /HPF (ref 0–2)
Renal Epithel, UA: NONE SEEN /HPF
WBC, UA: NONE SEEN /HPF (ref 0–5)
Yeast, UA: NONE SEEN

## 2024-07-27 LAB — URINALYSIS, COMPLETE
Bilirubin, UA: NEGATIVE
Glucose, UA: NEGATIVE
Nitrite, UA: NEGATIVE
Protein,UA: NEGATIVE
RBC, UA: NEGATIVE
Specific Gravity, UA: 1.015 (ref 1.005–1.030)
Urobilinogen, Ur: 0.2 mg/dL (ref 0.2–1.0)
pH, UA: 5.5 (ref 5.0–7.5)

## 2024-07-27 NOTE — Addendum Note (Signed)
 Addended by: Sharrie Self, MARY-MARGARET on: 07/27/2024 02:37 PM   Modules accepted: Orders

## 2024-07-27 NOTE — Progress Notes (Signed)
 Subjective:    Patient ID: Amber Winters, female    DOB: Mar 12, 1956, 68 y.o.   MRN: [id number]   Chief Complaint: medical management of chronic issues     HPI:  Amber Winters is a 68 y.o. who identifies as a female who was assigned female at birth.   Social history: Lives with: husband Work history: retired from school system   Comes in today for follow up of the following chronic medical issues:  1. Annual physical exam No pap today  2. ILD (interstitial lung disease) (HCC) Says that her breathing is good. No issues  3. Vitamin D  deficiency Is on daily vitamin d  supplement   New complaints: None today  Allergies  Allergen Reactions   Ciprofloxacin  Other (See Comments)    Patient states this causes severe muscle aching and discomfort.   Nitrofurantoin  Other (See Comments)    ILD   Asa [Aspirin]     Tinnitis   Sulfa  Antibiotics Nausea And Vomiting   Zithromax [Azithromycin]     Abdominal cramps   Outpatient Encounter Medications as of 07/27/2024  Medication Sig   Ascorbic Acid (VITAMIN C) 100 MG tablet Take 100 mg by mouth daily.   cephALEXin  (KEFLEX ) 250 MG capsule Take 250 mg by mouth daily.   cyanocobalamin 100 MCG tablet Take 100 mcg by mouth daily.   MAGNESIUM  PO Take 1 tablet by mouth daily.   Multiple Vitamins-Minerals (ZINC PO) Take by mouth.   NON FORMULARY Take 1 tablet by mouth daily. Liver health   VITAMIN D  PO Take 1 tablet by mouth daily.   vitamin E 1000 UNIT capsule Take 1,000 Units by mouth daily.   zinc gluconate 50 MG tablet Take 50 mg by mouth daily.   No facility-administered encounter medications on file as of 07/27/2024.    Past Surgical History:  Procedure Laterality Date   ABDOMINAL HYSTERECTOMY  2000   BRONCHIAL WASHINGS  11/12/2019   Procedure: BRONCHIAL WASHINGS;  Surgeon: Geronimo Amel, MD;  Location: [facility];  Service: Endoscopy;;   CATARACT EXTRACTION, BILATERAL     CHOLECYSTECTOMY  2001   HERNIA REPAIR     URETHRAL  SLING     VIDEO BRONCHOSCOPY  11/12/2019   Procedure: VIDEO BRONCHOSCOPY WITH FLUORO;  Surgeon: Geronimo Amel, MD;  Location: [facility];  Service: Endoscopy;;    Family History  Problem Relation Age of Onset   Ovarian cancer Mother    Anuerysm Father    Healthy Daughter    Healthy Daughter    Hypertension Brother    Colon cancer Neg Hx    Esophageal cancer Neg Hx    Inflammatory bowel disease Neg Hx    Liver disease Neg Hx    Pancreatic cancer Neg Hx    Rectal cancer Neg Hx    Stomach cancer Neg Hx    Breast cancer Neg Hx       Controlled substance contract: n/a     Review of Systems  Constitutional:  Negative for diaphoresis.  Eyes:  Negative for pain.  Respiratory:  Negative for shortness of breath.   Cardiovascular:  Negative for chest pain, palpitations and leg swelling.  Gastrointestinal:  Negative for abdominal pain.  Endocrine: Negative for polydipsia.  Skin:  Negative for rash.  Neurological:  Negative for dizziness, weakness and headaches.  Hematological:  Does not bruise/bleed easily.  All other systems reviewed and are negative.      Objective:   Physical Exam Vitals and nursing note reviewed.  Constitutional:      General: She is not in acute distress.    Appearance: Normal appearance. She is well-developed.  HENT:     Head: Normocephalic.     Right Ear: Tympanic membrane normal.     Left Ear: Tympanic membrane normal.     Nose: Nose normal.     Mouth/Throat:     Mouth: Mucous membranes are moist.  Eyes:     Pupils: Pupils are equal, round, and reactive to light.  Neck:     Vascular: No carotid bruit or JVD.  Cardiovascular:     Rate and Rhythm: Normal rate and regular rhythm.     Heart sounds: Normal heart sounds.  Pulmonary:     Effort: Pulmonary effort is normal. No respiratory distress.     Breath sounds: Normal breath sounds. No wheezing or rales.  Chest:     Chest wall: No tenderness.  Abdominal:     General: Bowel sounds  are normal. There is no distension or abdominal bruit.     Palpations: Abdomen is soft. There is no hepatomegaly, splenomegaly, mass or pulsatile mass.     Tenderness: There is no abdominal tenderness.  Musculoskeletal:        General: Normal range of motion.     Cervical back: Normal range of motion and neck supple.  Lymphadenopathy:     Cervical: No cervical adenopathy.  Skin:    General: Skin is warm and dry.  Neurological:     Mental Status: She is alert and oriented to person, place, and time.     Deep Tendon Reflexes: Reflexes are normal and symmetric.  Psychiatric:        Behavior: Behavior normal.        Thought Content: Thought content normal.        Judgment: Judgment normal.    BP 129/80   Pulse 67   Temp (!) 96.6 F (35.9 C) (Temporal)   Ht 4' 10 (1.473 m)   Wt 129 lb (58.5 kg)   SpO2 98%   BMI 26.96 kg/m          Assessment & Plan:   Amber Winters comes in today with chief complaint of annual physical  Diagnosis and orders addressed:  1. Annual physical exam  - CBC with Differential/Platelet - CMP14+EGFR - Lipid panel - Thyroid  Panel With TSH  2. ILD (interstitial lung disease) (HCC) Report any breathing issues  3. Vitamin D  deficiency Continue vitamin d  supplement - VITAMIN D  25 Hydroxy (Vit-D Deficiency, Fractures)   Labs pending Health Maintenance reviewed Diet and exercise encouraged  Follow up plan: 6 months   Mary-Margaret Gladis, FNP

## 2024-07-27 NOTE — Addendum Note (Signed)
 Addended by: Jerrell Hart, MARY-MARGARET on: 07/27/2024 02:39 PM   Modules accepted: Orders

## 2024-07-28 LAB — CMP14+EGFR
ALT: 19 IU/L (ref 0–32)
AST: 28 IU/L (ref 0–40)
Albumin: 4.5 g/dL (ref 3.9–4.9)
Alkaline Phosphatase: 76 IU/L (ref 49–135)
BUN/Creatinine Ratio: 36 — ABNORMAL HIGH (ref 12–28)
BUN: 24 mg/dL (ref 8–27)
Bilirubin Total: 0.9 mg/dL (ref 0.0–1.2)
CO2: 24 mmol/L (ref 20–29)
Calcium: 10 mg/dL (ref 8.7–10.3)
Chloride: 101 mmol/L (ref 96–106)
Creatinine, Ser: 0.66 mg/dL (ref 0.57–1.00)
Globulin, Total: 2.6 g/dL (ref 1.5–4.5)
Glucose: 82 mg/dL (ref 70–99)
Potassium: 4.3 mmol/L (ref 3.5–5.2)
Sodium: 138 mmol/L (ref 134–144)
Total Protein: 7.1 g/dL (ref 6.0–8.5)
eGFR: 96 mL/min/1.73 (ref >59)

## 2024-07-28 LAB — CBC WITH DIFFERENTIAL/PLATELET
Basophils Absolute: 0 x10E3/uL (ref 0.0–0.2)
Basos: 0 %
EOS (ABSOLUTE): 0.1 x10E3/uL (ref 0.0–0.4)
Eos: 1 %
Hematocrit: 41 % (ref 34.0–46.6)
Hemoglobin: 13.8 g/dL (ref 11.1–15.9)
Immature Grans (Abs): 0 x10E3/uL (ref 0.0–0.1)
Immature Granulocytes: 0 %
Lymphocytes Absolute: 2 x10E3/uL (ref 0.7–3.1)
Lymphs: 22 %
MCH: 32.5 pg (ref 26.6–33.0)
MCHC: 33.7 g/dL (ref 31.5–35.7)
MCV: 97 fL (ref 79–97)
Monocytes Absolute: 0.6 x10E3/uL (ref 0.1–0.9)
Monocytes: 6 %
Neutrophils Absolute: 6.4 x10E3/uL (ref 1.4–7.0)
Neutrophils: 71 %
Platelets: 265 x10E3/uL (ref 150–450)
RBC: 4.24 x10E6/uL (ref 3.77–5.28)
RDW: 11.9 % (ref 11.7–15.4)
WBC: 9.1 x10E3/uL (ref 3.4–10.8)

## 2024-07-28 LAB — LIPID PANEL
Chol/HDL Ratio: 3.5 ratio (ref 0.0–4.4)
Cholesterol, Total: 265 mg/dL — ABNORMAL HIGH (ref 100–199)
HDL: 76 mg/dL (ref >39)
LDL Chol Calc (NIH): 168 mg/dL — ABNORMAL HIGH (ref 0–99)
Triglycerides: 118 mg/dL (ref 0–149)
VLDL Cholesterol Cal: 21 mg/dL (ref 5–40)

## 2024-07-28 LAB — THYROID PANEL WITH TSH
Free Thyroxine Index: 2.6 (ref 1.2–4.9)
T3 Uptake Ratio: 36 % (ref 24–39)
T4, Total: 7.3 ug/dL (ref 4.5–12.0)
TSH: 1.25 u[IU]/mL (ref 0.450–4.500)

## 2024-07-28 LAB — CANCER ANTIGEN 19-9: CA 19-9: 37 U/mL — ABNORMAL HIGH (ref 0–35)

## 2024-07-28 LAB — VITAMIN D 25 HYDROXY (VIT D DEFICIENCY, FRACTURES): Vit D, 25-Hydroxy: 37.6 ng/mL (ref 30.0–100.0)

## 2024-07-30 ENCOUNTER — Ambulatory Visit: Payer: Self-pay | Admitting: Nurse Practitioner

## 2024-07-31 NOTE — Telephone Encounter (Signed)
 Called patient and left her a message about labs. She will reach out to GI about ca-19- cannot do much about cholesterol until she speaks with GI cause statins may affect her liver. Will send message to Clinicalpharmacist to make appt with herto discusscholesterol.

## 2024-08-01 ENCOUNTER — Telehealth: Payer: Self-pay | Admitting: Gastroenterology

## 2024-08-01 NOTE — Telephone Encounter (Signed)
 Pt has not been seen since 2022- she needs appt to discuss

## 2024-08-01 NOTE — Telephone Encounter (Signed)
 Noted will await a call back from pt

## 2024-08-01 NOTE — Telephone Encounter (Signed)
 Left message on machine to call back

## 2024-08-01 NOTE — Telephone Encounter (Signed)
 Inbound call from patient stating that she had labs drawn with her PCP Ronal Rollene Lunger on Friday afternoon and the labs came back and her liver cancer marker was higher than what it should be. She states she did not fast and she has been on antibiotics so she wasn't sure if that would or could effect it. She states that PCP wanted her to call our office to make Dr. Wilhelmenia aware and him give recommendations for patient.  Patient is requesting a call back to discuss next steps. Please advise.

## 2024-08-01 NOTE — Telephone Encounter (Signed)
 Spoke w pt about booking appt. Pt stated she would like a Friday appt and wasn't able to schedule at this time and she would like to call back with a better time.

## 2024-08-03 ENCOUNTER — Telehealth: Payer: Self-pay | Admitting: Pharmacist

## 2024-08-03 DIAGNOSIS — E782 Mixed hyperlipidemia: Secondary | ICD-10-CM

## 2024-08-03 NOTE — Telephone Encounter (Signed)
 Discussed lipids with PCP Patient to reach out to GI about CA19--> cannot do much about cholesterol until she speaks with GI because statins may affect her liver.  Lipid Panel     Component Value Date/Time   CHOL 265 (H) 07/27/2024 1447   TRIG 118 07/27/2024 1447   TRIG 101 09/06/2014 1031   HDL 76 07/27/2024 1447   HDL 69 09/06/2014 1031   CHOLHDL 3.5 07/27/2024 1447   LDLCALC 168 (H) 07/27/2024 1447   LDLCALC 155 (H) 07/06/2013 1023   LABVLDL 21 07/27/2024 1447      Latest Ref Rng & Units 07/27/2024    2:47 PM 10/08/2022    8:53 AM 07/23/2022   10:14 AM  CMP  Glucose 70 - 99 mg/dL 82   80   BUN 8 - 27 mg/dL 24   18   Creatinine 9.42 - 1.00 mg/dL 9.33   9.31   Sodium 865 - 144 mmol/L 138   138   Potassium 3.5 - 5.2 mmol/L 4.3   4.4   Chloride 96 - 106 mmol/L 101   102   CO2 20 - 29 mmol/L 24   25   Calcium  8.7 - 10.3 mg/dL 89.9   9.5   Total Protein 6.0 - 8.5 g/dL 7.1  7.1  6.7   Total Bilirubin 0.0 - 1.2 mg/dL 0.9  1.2  0.9   Alkaline Phos 49 - 135 IU/L 76  65  78   AST 0 - 40 IU/L 28  26  23    ALT 0 - 32 IU/L 19  20  17      Will continue to follow  Mliss Tarry Griffin, PharmD, BCACP, CPP Clinical Pharmacist, [facility]

## 2024-08-20 ENCOUNTER — Other Ambulatory Visit: Payer: Self-pay

## 2024-08-20 ENCOUNTER — Telehealth: Payer: Self-pay

## 2024-08-20 NOTE — Telephone Encounter (Signed)
 Pt called on 12/27 requesting a scopolamine  to be called in by 12/28 for cruise. Message not received until 12/29 no further action needed. LS

## 2024-09-04 NOTE — Progress Notes (Signed)
 N/a
